# Patient Record
Sex: Female | Born: 1975 | Hispanic: No | Marital: Single | Smoking: Former smoker
Health system: Southern US, Community
[De-identification: ages and names within clinical notes are randomized; demographics above are authoritative.]

## PROBLEM LIST (undated history)

## (undated) DIAGNOSIS — E119 Type 2 diabetes mellitus without complications: Secondary | ICD-10-CM

## (undated) DIAGNOSIS — I10 Essential (primary) hypertension: Secondary | ICD-10-CM

---

## 2017-12-19 ENCOUNTER — Ambulatory Visit (INDEPENDENT_AMBULATORY_CARE_PROVIDER_SITE_OTHER): Payer: Self-pay

## 2017-12-19 ENCOUNTER — Ambulatory Visit (HOSPITAL_COMMUNITY)
Admission: EM | Admit: 2017-12-19 | Discharge: 2017-12-19 | Disposition: A | Discharge status: Home / Self Care | Payer: Self-pay

## 2017-12-19 ENCOUNTER — Encounter (HOSPITAL_COMMUNITY): Payer: Self-pay | Admitting: Emergency Medicine

## 2017-12-19 DIAGNOSIS — M79671 Pain in right foot: Secondary | ICD-10-CM

## 2017-12-19 HISTORY — DX: Type 2 diabetes mellitus without complications: E11.9

## 2017-12-19 MED ORDER — CEPHALEXIN 500 MG PO CAPS: 500.0000 mg | ORAL_CAPSULE | Freq: Four times a day (QID) | ORAL | 0 refills | Status: DC

## 2017-12-19 NOTE — Discharge Instructions (Addendum)
It was nice meeting you!!  No foreign body seen in the foot on xray.  You can do warm soaks to the foot. Keep covered and use bacitracin to prevent infection.  Follow up as needed for continued or worsening symptoms

## 2017-12-19 NOTE — ED Triage Notes (Signed)
Pt here after stepping on piece of wood that went through her right foot

## 2017-12-20 ENCOUNTER — Encounter (HOSPITAL_COMMUNITY): Payer: Self-pay | Admitting: Family Medicine

## 2017-12-20 NOTE — ED Provider Notes (Signed)
[facility]    CSN: [id number] Arrival date & time: 12/19/17  1135     History   Chief Complaint Chief Complaint  Patient presents with  . Foreign Body in Skin    appt 1144    HPI Miranda Barnett is a 42 y.o. female.   Patient is a 42 year old female with past medical history of diabetes.  She presents with sliver in right plantar foot.  This occurred 2 days ago.  She reports that she got a piece of the wood out but she believes there is still a piece in her foot. She has been trying with her boyfriend to get it out. She can feel something when she applies pressure and walks on it. She denies any numbness, tingling or decreased sensation in the foot.  She has been soaking her foot.   ROS per HPI      Past Medical History:  Diagnosis Date  . Diabetes mellitus without complication (HCC)     There are no active problems to display for this patient.   History reviewed. No pertinent surgical history.  OB History   None      Home Medications    Prior to Admission medications   Medication Sig Start Date End Date Taking? Authorizing Provider  insulin NPH-regular Human (NOVOLIN 70/30) (70-30) 100 UNIT/ML injection Inject 40 Units into the skin 2 (two) times daily with a meal.   Yes [provider]  cephALEXin (KEFLEX) 500 MG capsule Take 1 capsule (500 mg total) by mouth 4 (four) times daily. 12/19/17   Janace ArisBast, Krystianna Soth A, NP    Family History History reviewed. No pertinent family history.  Social History Social History   Tobacco Use  . Smoking status: Never Smoker  . Smokeless tobacco: Never Used  Substance Use Topics  . Alcohol use: Never    Frequency: Never  . Drug use: Never     Allergies   Patient has no known allergies.   Review of Systems Review of Systems   Physical Exam Triage Vital Signs ED Triage Vitals [12/19/17 1208]  Enc Vitals Group     BP (!) 135/93     Pulse Rate 80     Resp 16     Temp 98.2 F (36.8 C)     Temp  Source Oral     SpO2 96 %     Weight      Height      Head Circumference      Peak Flow      Pain Score      Pain Loc      Pain Edu?      Excl. in GC?    No data found.  Updated Vital Signs BP (!) 135/93 (BP Location: Right Arm)   Pulse 80   Temp 98.2 F (36.8 C) (Oral)   Resp 16   SpO2 96%   Visual Acuity Right Eye Distance:   Left Eye Distance:   Bilateral Distance:    Right Eye Near:   Left Eye Near:    Bilateral Near:     Physical Exam  Constitutional: She is oriented to person, place, and time. She appears well-developed and well-nourished.  HENT:  Head: Normocephalic and atraumatic.  Eyes: Pupils are equal, round, and reactive to light.  Neck: Normal range of motion.  Pulmonary/Chest: Effort normal.  Musculoskeletal: Normal range of motion.  Tender to plantar foot localized to foreign body entry. No swelling or erythema.   Neurological: She  is oriented to person, place, and time.  Skin: Skin is warm.  Psychiatric: She has a normal mood and affect.  Nursing note and vitals reviewed.    UC Treatments / Results  Labs (all labs ordered are listed, but only abnormal results are displayed) Labs Reviewed - No data to display  EKG None  Radiology Dg Foot Complete Right  Result Date: 12/19/2017 CLINICAL DATA:  Stepped on sharp piece of wood 2 days ago. Heel pain. EXAM: RIGHT FOOT COMPLETE - 3+ VIEW COMPARISON:  None. FINDINGS: No visible radiopaque foreign body. No soft tissue gas. No fracture, subluxation or dislocation. IMPRESSION: No radiopaque foreign body or acute bony abnormality. Electronically Signed   By: Charlett NoseKevin  Dover M.D.   On: 12/19/2017 12:37    Procedures Procedures (including critical care time)  Medications Ordered in UC Medications - No data to display  Initial Impression / Assessment and Plan / UC Course  I have reviewed the triage vital signs and the nursing notes.  Pertinent labs & imaging results that were available during my care  of the patient were reviewed by me and considered in my medical decision making (see chart for details).     Xray revealed no foreign body. Pt very insistent on exploring the foot.  Agreed and attempted to explore the foot. Shaved some of the skin off around the area with 11 blade. Used tweezers.  Thought I visualized foreign body but unable to extract anything. Some serous fluid expressed but no foreign body  Decided to stop due to no obvious foreign body and pt being a diabetic.  Pt agreed.  Will prophylactically with antibiotics. Follow up as needed for continued or worsening symptoms  Final Clinical Impressions(s) / UC Diagnoses   Final diagnoses:  Foot pain, right     Discharge Instructions     It was nice meeting you!!  No foreign body seen in the foot on xray.  You can do warm soaks to the foot. Keep covered and use bacitracin to prevent infection.  Follow up as needed for continued or worsening symptoms     ED Prescriptions    Medication Sig Dispense Auth. Provider   cephALEXin (KEFLEX) 500 MG capsule Take 1 capsule (500 mg total) by mouth 4 (four) times daily. 28 capsule Dahlia ByesBast, Osker Ayoub A, NP     Controlled Substance Prescriptions [place] Controlled Substance Registry consulted? Not Applicable   Janace ArisBast, Clarion Mooneyhan A, NP 12/20/17 1449

## 2018-05-07 DIAGNOSIS — I10 Essential (primary) hypertension: Secondary | ICD-10-CM | POA: Diagnosis not present

## 2018-05-07 DIAGNOSIS — F419 Anxiety disorder, unspecified: Secondary | ICD-10-CM | POA: Diagnosis not present

## 2018-05-07 DIAGNOSIS — E119 Type 2 diabetes mellitus without complications: Secondary | ICD-10-CM | POA: Diagnosis not present

## 2018-05-07 DIAGNOSIS — Z1389 Encounter for screening for other disorder: Secondary | ICD-10-CM | POA: Diagnosis not present

## 2018-10-10 DIAGNOSIS — Z3201 Encounter for pregnancy test, result positive: Secondary | ICD-10-CM | POA: Diagnosis not present

## 2018-11-08 DIAGNOSIS — O0990 Supervision of high risk pregnancy, unspecified, unspecified trimester: Secondary | ICD-10-CM | POA: Insufficient documentation

## 2018-11-09 ENCOUNTER — Ambulatory Visit (INDEPENDENT_AMBULATORY_CARE_PROVIDER_SITE_OTHER): Payer: Medicaid Other | Admitting: Obstetrics & Gynecology

## 2018-11-09 ENCOUNTER — Other Ambulatory Visit (HOSPITAL_COMMUNITY)
Admission: RE | Admit: 2018-11-09 | Discharge: 2018-11-09 | Disposition: A | Discharge status: Home / Self Care | Payer: Medicaid Other | Source: Ambulatory Visit | Attending: Obstetrics & Gynecology | Admitting: Obstetrics & Gynecology

## 2018-11-09 ENCOUNTER — Encounter: Payer: Self-pay | Admitting: Obstetrics & Gynecology

## 2018-11-09 ENCOUNTER — Other Ambulatory Visit: Payer: Self-pay

## 2018-11-09 DIAGNOSIS — Z3A15 15 weeks gestation of pregnancy: Secondary | ICD-10-CM

## 2018-11-09 DIAGNOSIS — O99210 Obesity complicating pregnancy, unspecified trimester: Secondary | ICD-10-CM

## 2018-11-09 DIAGNOSIS — O99212 Obesity complicating pregnancy, second trimester: Secondary | ICD-10-CM

## 2018-11-09 DIAGNOSIS — O09529 Supervision of elderly multigravida, unspecified trimester: Secondary | ICD-10-CM | POA: Insufficient documentation

## 2018-11-09 DIAGNOSIS — Z3490 Encounter for supervision of normal pregnancy, unspecified, unspecified trimester: Secondary | ICD-10-CM

## 2018-11-09 DIAGNOSIS — O09522 Supervision of elderly multigravida, second trimester: Secondary | ICD-10-CM

## 2018-11-09 DIAGNOSIS — O24319 Unspecified pre-existing diabetes mellitus in pregnancy, unspecified trimester: Secondary | ICD-10-CM | POA: Insufficient documentation

## 2018-11-09 DIAGNOSIS — Z3482 Encounter for supervision of other normal pregnancy, second trimester: Secondary | ICD-10-CM | POA: Diagnosis not present

## 2018-11-09 DIAGNOSIS — O24312 Unspecified pre-existing diabetes mellitus in pregnancy, second trimester: Secondary | ICD-10-CM

## 2018-11-09 MED ORDER — BLOOD PRESSURE MONITOR KIT: 1.0000 | PACK | 0 refills | Status: AC

## 2018-11-09 MED ORDER — INSULIN NPH (HUMAN) (ISOPHANE) 100 UNIT/ML ~~LOC~~ SUSP: SUBCUTANEOUS | 3 refills | Status: DC

## 2018-11-09 MED ORDER — INSULIN LISPRO 100 UNIT/ML ~~LOC~~ SOLN: SUBCUTANEOUS | 11 refills | Status: DC

## 2018-11-09 MED ORDER — ASPIRIN EC 81 MG PO TBEC: 81.0000 mg | DELAYED_RELEASE_TABLET | Freq: Every day | ORAL | 5 refills | Status: DC

## 2018-11-09 NOTE — Progress Notes (Signed)
  Subjective:    Miranda Barnett is being seen today for her first obstetrical visit. P2 (52 and 41 yo daughters)   This is not a planned pregnancy. She is at [redacted]w[redacted]d gestation. Her obstetrical history is significant for advanced maternal age, obesity and ama, DM. Relationship with FOB: significant other, not living together. Patient does intend to breast feed. Pregnancy history fully reviewed.  Patient reports no complaints.  Review of Systems:   Review of Systems  She is a homemaker. She has had DM since during her last pregnancy. She has been on insulin since then.   Objective:     BP 130/83   Pulse 93   Temp 98.8 F (37.1 C)   Ht 5\' 8"  (1.727 m)   Wt 239 lb 8 oz (108.6 kg)   LMP 07/26/2018   BMI 36.42 kg/m  Physical Exam  Exam  Breathing, conversing, and ambulating normally Well nourished, well hydrated Black female, no apparent distress Heart- rrr Lungs- CTAB Abd- benign, obese    Assessment:    Pregnancy: [id number] Patient Active Problem List   Diagnosis Date Noted  . AMA (advanced maternal age) multigravida 35+ 11/09/2018  . Obesity in pregnancy 11/09/2018  . Unsp pre-existing diabetes in pregnancy, second trimester 11/09/2018  . Supervision of normal pregnancy, antepartum 11/08/2018       Plan:     Initial labs drawn. Prenatal vitamins. Problem list reviewed and updated. Role of ultrasound in pregnancy discussed; fetal survey: ordered. Amniocentesis discussed: declined. Rec weight gain of 20 pounds or less [employer], Mychart downloaded today rec check sugars 5 x day Check HBA1c, pap smear, labs, cmeta, pr/cr Rec baby asa She reports that her sugars are between 190s- 200s I really stressed the importance of good glycemic control (fastings less than 90, 2 hour PCs less than 120) She is taking 60 units BID of humulin insulin I have told her to stop that dosing and start: 34 units of NPH with breakfast 14 units of Humalog with breakfast 18 units of  Humalog with supper 14 untis of NPH at bedtime Virtual visit in 1 week  Trystin Hargrove C Dareth Andrew 11/09/2018

## 2018-11-09 NOTE — Progress Notes (Signed)
Pt presents for NOB visit.  Appetite is suppressed. Drinks ginger tea to avoid N&V

## 2018-11-11 LAB — PROTEIN / CREATININE RATIO, URINE
Creatinine, Urine: 218.5 mg/dL
Protein, Ur: 19.6 mg/dL
Protein/Creat Ratio: 90 mg/g creat (ref 0–200)

## 2018-11-11 LAB — CULTURE, OB URINE

## 2018-11-11 LAB — URINE CULTURE, OB REFLEX

## 2018-11-12 LAB — COMPREHENSIVE METABOLIC PANEL
ALT: 16 IU/L (ref 0–32)
AST: 11 IU/L (ref 0–40)
Albumin/Globulin Ratio: 1.4 (ref 1.2–2.2)
Albumin: 4.2 g/dL (ref 3.8–4.8)
Alkaline Phosphatase: 50 IU/L (ref 39–117)
BUN/Creatinine Ratio: 13 (ref 9–23)
BUN: 8 mg/dL (ref 6–24)
Bilirubin Total: 0.3 mg/dL (ref 0.0–1.2)
CO2: 19 mmol/L — ABNORMAL LOW (ref 20–29)
Calcium: 9.7 mg/dL (ref 8.7–10.2)
Chloride: 102 mmol/L (ref 96–106)
Creatinine, Ser: 0.62 mg/dL (ref 0.57–1.00)
GFR calc Af Amer: 130 mL/min/{1.73_m2} (ref >59)
GFR calc non Af Amer: 112 mL/min/{1.73_m2} (ref >59)
Globulin, Total: 3 g/dL (ref 1.5–4.5)
Glucose: 87 mg/dL (ref 65–99)
Potassium: 4.4 mmol/L (ref 3.5–5.2)
Sodium: 135 mmol/L (ref 134–144)
Total Protein: 7.2 g/dL (ref 6.0–8.5)

## 2018-11-12 LAB — OBSTETRIC PANEL, INCLUDING HIV
Antibody Screen: NEGATIVE
Basophils Absolute: 0 10*3/uL (ref 0.0–0.2)
Basos: 0 %
EOS (ABSOLUTE): 0.1 10*3/uL (ref 0.0–0.4)
Eos: 1 %
HIV Screen 4th Generation wRfx: NONREACTIVE
Hematocrit: 37.3 % (ref 34.0–46.6)
Hemoglobin: 12.4 g/dL (ref 11.1–15.9)
Hepatitis B Surface Ag: NEGATIVE
Immature Grans (Abs): 0 10*3/uL (ref 0.0–0.1)
Immature Granulocytes: 0 %
Lymphocytes Absolute: 2.1 10*3/uL (ref 0.7–3.1)
Lymphs: 21 %
MCH: 30.8 pg (ref 26.6–33.0)
MCHC: 33.2 g/dL (ref 31.5–35.7)
MCV: 93 fL (ref 79–97)
Monocytes Absolute: 0.6 10*3/uL (ref 0.1–0.9)
Monocytes: 6 %
Neutrophils Absolute: 7.4 10*3/uL — ABNORMAL HIGH (ref 1.4–7.0)
Neutrophils: 72 %
Platelets: 340 10*3/uL (ref 150–450)
RBC: 4.02 x10E6/uL (ref 3.77–5.28)
RDW: 13.1 % (ref 11.7–15.4)
RPR Ser Ql: NONREACTIVE
Rh Factor: POSITIVE
Rubella Antibodies, IGG: 3.36 index (ref >0.99)
WBC: 10.2 10*3/uL (ref 3.4–10.8)

## 2018-11-12 LAB — HEMOGLOBIN A1C
Est. average glucose Bld gHb Est-mCnc: 160 mg/dL
Hgb A1c MFr Bld: 7.2 % — ABNORMAL HIGH (ref 4.8–5.6)

## 2018-11-13 LAB — CYTOLOGY - PAP
Chlamydia: NEGATIVE
Diagnosis: NEGATIVE
HPV: NOT DETECTED
Neisseria Gonorrhea: NEGATIVE

## 2018-11-15 ENCOUNTER — Other Ambulatory Visit: Payer: Self-pay | Admitting: Obstetrics & Gynecology

## 2018-11-16 ENCOUNTER — Ambulatory Visit (INDEPENDENT_AMBULATORY_CARE_PROVIDER_SITE_OTHER): Payer: Medicaid Other | Admitting: Medical

## 2018-11-16 ENCOUNTER — Encounter: Payer: Self-pay | Admitting: Medical

## 2018-11-16 DIAGNOSIS — O24312 Unspecified pre-existing diabetes mellitus in pregnancy, second trimester: Secondary | ICD-10-CM

## 2018-11-16 DIAGNOSIS — Z3A16 16 weeks gestation of pregnancy: Secondary | ICD-10-CM

## 2018-11-16 DIAGNOSIS — O99210 Obesity complicating pregnancy, unspecified trimester: Secondary | ICD-10-CM

## 2018-11-16 DIAGNOSIS — O09522 Supervision of elderly multigravida, second trimester: Secondary | ICD-10-CM

## 2018-11-16 DIAGNOSIS — O99212 Obesity complicating pregnancy, second trimester: Secondary | ICD-10-CM

## 2018-11-16 DIAGNOSIS — Z3490 Encounter for supervision of normal pregnancy, unspecified, unspecified trimester: Secondary | ICD-10-CM

## 2018-11-16 MED ORDER — GLUCOSE BLOOD VI STRP: ORAL_STRIP | 12 refills | Status: DC

## 2018-11-16 NOTE — Patient Instructions (Signed)

## 2018-11-16 NOTE — Progress Notes (Signed)
   Concern about dating  [place] at women's choice   I connected with Miranda Barnett on 08/07/18 at  9:15 AM EDT by: WebEx and verified that I am speaking with the correct person using two identifiers.  Patient is located at home and provider is located at [facility].     The purpose of this virtual visit is to provide medical care while limiting exposure to the novel coronavirus. I discussed the limitations, risks, security and privacy concerns of performing an evaluation and management service by WebEx and the availability of in person appointments. I also discussed with the patient that there may be a patient responsible charge related to this service. By engaging in this virtual visit, you consent to the provision of healthcare.  Additionally, you authorize for your insurance to be billed for the services provided during this visit.  The patient expressed understanding and agreed to proceed.  Ms. Miranda Barnett is a [id number] at [redacted]w[redacted]d who requested a virtual visit today to discuss her insulin plan. At her new OB visit last week her insulin regimen was adjusted from her pre-pregnancy plan by Dr. Hulan Fray. The patient was unclear about the plan and has not yet started. She states this past week her fasting BS have been between 72-96 with most values above 80. She states her PP BS have been between 110-152. She has an appointment with Diabetes education on 11/27/18.   I have discussed the plan with Dr. Elly Modena who agrees with the proposed regimen. I have explained the following to the patient:  34 units of NPH with breakfast 14 units of Humalog with breakfast  18 units of Humalog with supper  14 untis of NPH at bedtime   I have explained the difference between her previously prescribed Novolin R and the currently prescribed Novolin N. I have explained the importance of eating with Humalog doses. The patient plans to start this regiment ASAP and will continue to check CBGs 4 times per day.   The patient also  has concerns about the accuracy of her due date. She states that she had an US done at a clinic at [redacted]w[redacted]d in May. By this dating her due date would be ~ 2 weeks earlier than we have documented based on LMP. I have asked the patient to get a copy of the [place] report from that clinic for review. I have assured her that now that she is in the second trimester there would be no benefit to another [place] for dating prior to her anatomy US since the criteria for changes in [facility] are the same at 16 weeks as at 19 weeks. I have also assured her that we will have her [place] recommended dating prior to the point in pregnancy when it is needed to determine when to start antenatal testing or schedule IOL.   Patient voiced understanding of all of the above. She will follow-up with diabetes education on 11/27/18. She has anatomy US scheduled for 12/06/18. She already has a follow-up for her next OB appointment on 12/16/18 with Dr. Elly Modena.   Luvenia Redden, PA-C 11/16/2018 12:13 PM

## 2018-11-16 NOTE — Progress Notes (Signed)
Pt presents for Webex visit. Pt identified with 2 pt identifiers. Pt is still waiting on on bp kit. Pt would like to discuss her dates. She would also like to discuss her insulin.

## 2018-11-19 ENCOUNTER — Encounter: Payer: Self-pay | Admitting: Obstetrics & Gynecology

## 2018-11-20 ENCOUNTER — Encounter: Payer: Self-pay | Admitting: Obstetrics & Gynecology

## 2018-11-21 ENCOUNTER — Encounter: Payer: Self-pay | Admitting: Obstetrics and Gynecology

## 2018-11-21 DIAGNOSIS — Z98891 History of uterine scar from previous surgery: Secondary | ICD-10-CM | POA: Insufficient documentation

## 2018-11-21 DIAGNOSIS — Z3481 Encounter for supervision of other normal pregnancy, first trimester: Secondary | ICD-10-CM | POA: Diagnosis not present

## 2018-11-27 ENCOUNTER — Other Ambulatory Visit: Payer: Self-pay

## 2018-11-27 ENCOUNTER — Ambulatory Visit: Payer: Medicaid Other | Admitting: *Deleted

## 2018-11-27 ENCOUNTER — Encounter: Payer: Medicaid Other | Attending: Obstetrics & Gynecology | Admitting: *Deleted

## 2018-11-27 DIAGNOSIS — O24312 Unspecified pre-existing diabetes mellitus in pregnancy, second trimester: Secondary | ICD-10-CM | POA: Diagnosis not present

## 2018-11-27 DIAGNOSIS — O09522 Supervision of elderly multigravida, second trimester: Secondary | ICD-10-CM | POA: Insufficient documentation

## 2018-11-27 DIAGNOSIS — Z3A00 Weeks of gestation of pregnancy not specified: Secondary | ICD-10-CM | POA: Insufficient documentation

## 2018-11-27 DIAGNOSIS — Z713 Dietary counseling and surveillance: Secondary | ICD-10-CM | POA: Diagnosis not present

## 2018-11-28 NOTE — Progress Notes (Signed)
Patient was seen on 11/27/2018 for type 2 Diabetes and pregnancy self-management. EDD 05/02/2019. Patient states history of this diabetes for a couple of years.  She states she had GDM with her 2nd daughter in 2018 and the diabetes came back after she was born. She has been on Novolin 70/30 insulin since then. Her insulin doses have just been changed, see below. Diet history obtained. Patient eats good variety of all food groups and beverages include water along with sweet tea, lemonade and regular soda.  She states some training on Carbohydrate Counting but has not been using it lately, she would like a review. Patient is currently on Metformin diabetes medication in addition to insulin. She states she is active with her children at home and does stretches and yoga regularly   Current insulin dose: NPH @ 34 units before breakfast and 14 units at bedtime Humalog @ 18 units before breakfast and 14 units before supper  She has 100 unit syringes so I taught her how to mix the NPH and Novolog before breakfast and take 1 injection instead of 2. This is a total of 52 units in the AM. She expressed understanding with return demonstration.   I also mentioned Omnipod as an alternative insulin delivery option. She will consider and discuss with her fiancee and let me know if she wants to pursue.   The following learning objectives were also met by the patient :   States the definition of type 2 Diabetes and pregnancy   States why dietary management is important in controlling blood glucose  Describes the effects of carbohydrates on blood glucose levels  Demonstrates ability to create a balanced meal plan  Demonstrates carbohydrate counting   States when to check blood glucose levels  Demonstrates proper blood glucose monitoring techniques  States the effect of stress and exercise on blood glucose levels  States the importance of limiting caffeine and abstaining from alcohol and smoking  Plan:    Aim for 3 Carb Choices per meal (45 grams) +/- 1 either way   Aim for 1-2 Carbs per snack  Begin reading food labels for Total Carbohydrate of foods  Consider  increasing your activity level by walking or other activity daily as tolerated  Begin checking BG before breakfast and 2 hours after first bite of breakfast, lunch and dinner as directed by MD   Bring Log Book/Sheet and meter to every medical appointment OR use Baby Scripts (see below)  Patient was introduced to [employer], she plans to use as record of BG electronically (She had fallen behind in posting her BG to [employer]. She agrees to add in the missing data so I can see her most recent BG. I will then assess to see if insulin doses need to be adjusted.  Take medication as directed by MD  Patient already has a meter:  And is testing pre breakfast and 2 hours each meal as directed by MD Plan to review Baby Scripts tomorrow.   Patient instructed to monitor glucose levels: FBS: 60 - 95 mg/dl 2 hour: <120 mg/dl  Patient received the following handouts:  Nutrition Diabetes and Pregnancy  Carbohydrate Counting List  Patient will be seen for follow-up in 1 month and as needed.

## 2018-12-04 ENCOUNTER — Other Ambulatory Visit: Payer: Self-pay | Admitting: Obstetrics & Gynecology

## 2018-12-04 ENCOUNTER — Telehealth: Payer: Self-pay | Admitting: *Deleted

## 2018-12-04 DIAGNOSIS — O24312 Unspecified pre-existing diabetes mellitus in pregnancy, second trimester: Secondary | ICD-10-CM

## 2018-12-04 MED ORDER — INSULIN NPH (HUMAN) (ISOPHANE) 100 UNIT/ML ~~LOC~~ SUSP: SUBCUTANEOUS | 3 refills | Status: DC

## 2018-12-04 MED ORDER — INSULIN LISPRO 100 UNIT/ML ~~LOC~~ SOLN: SUBCUTANEOUS | 11 refills | Status: DC

## 2018-12-04 NOTE — Progress Notes (Signed)
As per the Diabetic Specialist, review of patient's blood sugars showed elevated values. Recommendations for increased insulin ordered as below.   FBG for past several days are 86-132 with only 3 in target range  Post meal BG have been 115-143 with only 3 out of 17 in target range, most in the 130's   Recommendation:  Current dose:     Increase to:  AM NPH @ 34       38     Reg @ 18       22  Supper    Reg 14          20  Bedtime     NPH 14         20   Orders: - insulin NPH Human (NOVOLIN N) 100 UNIT/ML injection; Inject 38 units in the morning and 20 units at bedtime  Dispense: 10 mL; Refill: 3 - insulin lispro (HUMALOG) 100 UNIT/ML injection; Inject 22 units with breakfast and 20 units with supper/evening meal  Dispense: 10 mL; Refill: 11  Patient was informed to change dosages accordingly. Will monitor effect and continue to adjust regimen as needed.    Verita Schneiders, MD, [place] for [employer], [employer]

## 2018-12-04 NOTE — Telephone Encounter (Signed)
I called patient to let her know that her insulin doses were being increased per Dr. Jeani Sow. Anyanwu. I offered to text the changes to her so she would have them in writing and she agreed.       Pre breakfast NPH:   34 up to 38 Reg:    18 up to 22  Supper Reg:     14 up to 20  Bedtime NPH>     14 up to 20  She texted me back her understanding.   I will follow up within 2 days on Baby Scripts to check readings.

## 2018-12-05 NOTE — Progress Notes (Signed)
Pt made aware of changes to insulin, verbalizes understanding.

## 2018-12-05 NOTE — Telephone Encounter (Signed)
Thank you :)

## 2018-12-06 ENCOUNTER — Other Ambulatory Visit (HOSPITAL_COMMUNITY): Payer: Self-pay | Admitting: *Deleted

## 2018-12-06 ENCOUNTER — Ambulatory Visit (HOSPITAL_COMMUNITY)
Admission: RE | Admit: 2018-12-06 | Discharge: 2018-12-06 | Disposition: A | Discharge status: Home / Self Care | Payer: Medicaid Other | Source: Ambulatory Visit | Attending: Obstetrics & Gynecology | Admitting: Obstetrics & Gynecology

## 2018-12-06 ENCOUNTER — Encounter (HOSPITAL_COMMUNITY): Payer: Self-pay | Admitting: *Deleted

## 2018-12-06 ENCOUNTER — Other Ambulatory Visit: Payer: Self-pay

## 2018-12-06 ENCOUNTER — Ambulatory Visit (HOSPITAL_COMMUNITY): Payer: Medicaid Other | Admitting: *Deleted

## 2018-12-06 DIAGNOSIS — O09522 Supervision of elderly multigravida, second trimester: Secondary | ICD-10-CM

## 2018-12-06 DIAGNOSIS — Z3A21 21 weeks gestation of pregnancy: Secondary | ICD-10-CM

## 2018-12-06 DIAGNOSIS — Z3490 Encounter for supervision of normal pregnancy, unspecified, unspecified trimester: Secondary | ICD-10-CM | POA: Diagnosis not present

## 2018-12-06 DIAGNOSIS — O24112 Pre-existing diabetes mellitus, type 2, in pregnancy, second trimester: Secondary | ICD-10-CM | POA: Diagnosis not present

## 2018-12-06 DIAGNOSIS — O99210 Obesity complicating pregnancy, unspecified trimester: Secondary | ICD-10-CM

## 2018-12-06 DIAGNOSIS — O99212 Obesity complicating pregnancy, second trimester: Secondary | ICD-10-CM

## 2018-12-06 DIAGNOSIS — O24312 Unspecified pre-existing diabetes mellitus in pregnancy, second trimester: Secondary | ICD-10-CM

## 2018-12-06 DIAGNOSIS — Z362 Encounter for other antenatal screening follow-up: Secondary | ICD-10-CM

## 2018-12-11 ENCOUNTER — Encounter (HOSPITAL_COMMUNITY): Payer: Self-pay | Admitting: Obstetrics & Gynecology

## 2018-12-11 DIAGNOSIS — Z3A19 19 weeks gestation of pregnancy: Secondary | ICD-10-CM | POA: Diagnosis not present

## 2018-12-11 DIAGNOSIS — E119 Type 2 diabetes mellitus without complications: Secondary | ICD-10-CM | POA: Diagnosis not present

## 2018-12-11 DIAGNOSIS — O24112 Pre-existing diabetes mellitus, type 2, in pregnancy, second trimester: Secondary | ICD-10-CM | POA: Diagnosis not present

## 2018-12-14 ENCOUNTER — Other Ambulatory Visit: Payer: Self-pay

## 2018-12-14 ENCOUNTER — Telehealth (INDEPENDENT_AMBULATORY_CARE_PROVIDER_SITE_OTHER): Payer: Medicaid Other | Admitting: Obstetrics and Gynecology

## 2018-12-14 ENCOUNTER — Encounter: Payer: Self-pay | Admitting: Obstetrics and Gynecology

## 2018-12-14 DIAGNOSIS — Z98891 History of uterine scar from previous surgery: Secondary | ICD-10-CM

## 2018-12-14 DIAGNOSIS — O99212 Obesity complicating pregnancy, second trimester: Secondary | ICD-10-CM

## 2018-12-14 DIAGNOSIS — O09522 Supervision of elderly multigravida, second trimester: Secondary | ICD-10-CM

## 2018-12-14 DIAGNOSIS — Z3A20 20 weeks gestation of pregnancy: Secondary | ICD-10-CM

## 2018-12-14 DIAGNOSIS — O99210 Obesity complicating pregnancy, unspecified trimester: Secondary | ICD-10-CM

## 2018-12-14 DIAGNOSIS — O339 Maternal care for disproportion, unspecified: Secondary | ICD-10-CM | POA: Diagnosis not present

## 2018-12-14 DIAGNOSIS — Z3490 Encounter for supervision of normal pregnancy, unspecified, unspecified trimester: Secondary | ICD-10-CM

## 2018-12-14 DIAGNOSIS — O24312 Unspecified pre-existing diabetes mellitus in pregnancy, second trimester: Secondary | ICD-10-CM

## 2018-12-14 MED ORDER — COMFORT FIT MATERNITY SUPP LG MISC: 0 refills | Status: DC

## 2018-12-14 NOTE — Progress Notes (Signed)
TELEHEALTH OBSTETRICS PRENATAL VIRTUAL VIDEO VISIT ENCOUNTER NOTE  Provider [facility]: [facility]   I connected with Miranda Barnett on 12/14/18 at 10:00 AM EDT by MyChart Video Encounter at home and verified that I am speaking with the correct person using two identifiers.   I discussed the limitations, risks, security and privacy concerns of performing an evaluation and management service virtually and the availability of in person appointments. I also discussed with the patient that there may be a patient responsible charge related to this service. The patient expressed understanding and agreed to proceed. Subjective:  Miranda Barnett is a 43 y.o. G3P2002 at 3379w1d being seen today for ongoing prenatal care.  She is currently monitored for the following issues for this high-risk pregnancy and has Supervision of normal pregnancy, antepartum; AMA (advanced maternal age) multigravida 35+; Obesity in pregnancy; Unsp pre-existing diabetes in pregnancy, second trimester; and History of 2 cesarean sections on their problem list.  Patient reports sciatic nerve pain on left side.  Contractions: Not present. Vag. Bleeding: None.  Movement: Present. Denies any leaking of fluid.   The following portions of the patient's history were reviewed and updated as appropriate: allergies, current medications, past family history, past medical history, past social history, past surgical history and problem list.   Objective:  There were no vitals filed for this visit.  Fetal Status:     Movement: Present     General:  Alert, oriented and cooperative. Patient is in no acute distress.  Respiratory: Normal respiratory effort, no problems with respiration noted  Mental Status: Normal mood and affect. Normal behavior. Normal judgment and thought content.  Rest of physical exam deferred due to type of encounter  Imaging: Koreas Mfm Ob Detail +14 Wk  Result Date: 12/06/2018  ----------------------------------------------------------------------  OBSTETRICS REPORT                       (Signed Final 12/06/2018 11:33 am) ---------------------------------------------------------------------- Patient Info  ID #:       [id number]                          D.O.B.:  08/09/1975 (43 yrs)  Name:       Miranda Barnett                   Visit Date: 12/06/2018 09:18 am ---------------------------------------------------------------------- Performed By  Performed By:     Sandi MealyJovancia Adrien        Ref. Address:     [address]                    [address]                                                             [address]                                                             [place], [place]  [place]  Attending:        Tama High MD        [facility]  Referred By:      Emily Filbert MD ---------------------------------------------------------------------- Orders   #  Description                          Code         Ordered By   1  [place] MFM OB DETAIL +14 WK              76811.01     MYRA DOVE  ----------------------------------------------------------------------   #  Order #                    Accession #                 Episode #   1  [id number]                  [id number]                  [id number]  ---------------------------------------------------------------------- Indications   Encounter for antenatal screening for          Z36.3   malformations (Low Risk NIPS)   Pre-existing diabetes, type 2, in pregnancy,   O24.112   second trimester   Advanced maternal age multigravida 18+,        O65.522   second trimester   Obesity complicating pregnancy, second         O99.212   trimester   [redacted] weeks gestation of pregnancy                Z3A.21  ---------------------------------------------------------------------- Vital Signs  Weight (lb): 239                                Height:        5'8"  BMI:         36.34 ---------------------------------------------------------------------- Fetal Evaluation  Num Of Fetuses:         1  Fetal Heart Rate(bpm):  145  Cardiac Activity:       Observed  Presentation:           Cephalic  Placenta:               Anterior  P. Cord Insertion:      Visualized  Amniotic Fluid  AFI FV:      Within normal limits                              Largest Pocket(cm)                              5.53 ---------------------------------------------------------------------- Biometry  BPD:      48.6  mm     G. Age:  20w 5d  36  %    CI:        74.37   %    70 - 86                                                          FL/HC:      18.9   %    15.9 - 20.3  HC:      178.9  mm     G. Age:  20w 2d         15  %    HC/AC:      1.14        1.06 - 1.25  AC:      156.8  mm     G. Age:  20w 6d         37  %    FL/BPD:     69.8   %  FL:       33.9  mm     G. Age:  20w 5d         29  %    FL/AC:      21.6   %    20 - 24  CER:      20.7  mm     G. Age:  19w 5d         21  %  CM:        4.3  mm  Est. FW:     372  gm    0 lb 13 oz      30  % ---------------------------------------------------------------------- Gestational Age  U/S Today:     20w 5d                                        EDD:   04/20/19  Best:          [place]21w 0d     Det. By:  Previous Ultrasound      EDD:   04/18/19                                      (09/25/18) ---------------------------------------------------------------------- Anatomy  Cranium:               Appears normal         Aortic Arch:            Not well visualized  Cavum:                 Appears normal         Ductal Arch:            Appears normal  Ventricles:            Appears normal         Diaphragm:              Appears normal  Choroid Plexus:        Appears normal         Stomach:                Appears normal, left  sided  Cerebellum:            Appears normal          Abdomen:                Appears normal  Posterior Fossa:       Appears normal         Abdominal Wall:         Appears nml (cord                                                                        insert, abd wall)  Nuchal Fold:           Not applicable (>20    Cord Vessels:           Appears normal ([redacted]                         wks GA)                                        vessel cord)  Face:                  Appears normal         Kidneys:                Not well visualized                         (orbits and profile)  Lips:                  Not well visualized    Bladder:                Appears normal  Thoracic:              Appears normal         Spine:                  Ltd views no                                                                        intracranial signs of                                                                        NTD  Heart:                 Not well visualized    Upper Extremities:      Appears normal  RVOT:  Appears normal         Lower Extremities:      Appears normal  LVOT:                  Appears normal  Other:  Nasal bone visualized. Female gender Technically difficult due to fetal          position. ---------------------------------------------------------------------- Impression  G3 P2. Patient is her for fetal anatomy scan. On cell-free fetal  DNA screening, the risks of aneuploidies were not increased.  She has pregestational diabetes and her most-recent  hemoglobin A1C was 7.2%. Patient takes Novolin 32 units in  the morning and 20 units at night, and Humalog 20/0/20 units  at night. She reports that diabetes is well controlled. She also  has chronic hypertension and HCTZ for control.  Ob history is significant for 2 previous term cesarean  deliveries.  We performed a fetal anatomy scan. No markers of  aneuploidies or fetal structural defects are seen. Fetal  biometry is consistent with her previously-established dates.  Amniotic fluid is normal  and good fetal activity is seen.  Placenta is anterior and there is no evidence of previa or  accreta.  Patient understands the limitations of ultrasound in detecting  fetal anomalies.  I briefly discussed the importance of good blood glucose  control to prevent adverse fetal and neonatal outcomes. I  discussed and recommended fetal echocardiography. ---------------------------------------------------------------------- Recommendations  -An appointment was made for her to return in 4 weeks for  completion of fetal anatomy.  -We will set up an appointment for fetal echocardiography. ----------------------------------------------------------------------                  Noralee Space, MD Electronically Signed Final Report   12/06/2018 11:33 am ----------------------------------------------------------------------   Assessment and Plan:  Pregnancy: Z6X0960 at [redacted]w[redacted]d 1. Encounter for supervision of normal pregnancy, antepartum, unspecified gravidity Patient reports feeling well Rx for maternity belt provided and patient is encouraged to stay active   2. Unsp pre-existing diabetes in pregnancy, second trimester Normal fetal echo on 8/11 CBGs reviewed and majority within range. Continue insulin at current dosage and patient encourage to consume a protein rich snack Follow up anatomy and growth ultrasound in september  3. History of 2 cesarean sections Will be scheduled for repeat at a later date  4. Multigravida of advanced maternal age in second trimester Low risk NIPS  5. Obesity in pregnancy   Preterm labor symptoms and general obstetric precautions including but not limited to vaginal bleeding, contractions, leaking of fluid and fetal movement were reviewed in detail with the patient. I discussed the assessment and treatment plan with the patient. The patient was provided an opportunity to ask questions and all were answered. The patient agreed with the plan and demonstrated an understanding of  the instructions. The patient was advised to call back or seek an in-person office evaluation/go to MAU at [facility] for any urgent or concerning symptoms. Please refer to After Visit Summary for other counseling recommendations.   I provided 15 minutes of face-to-face time during this encounter.  Return in about 2 weeks (around 12/28/2018) for [facility], ROB, High risk.  Future Appointments  Date Time Provider Department Center  01/03/2019  7:45 AM WH-MFC NURSE [facility]-MFC MFC-US  01/03/2019  7:45 AM WH-MFC [place] 2 WH-MFCUS MFC-US    Catalina Antigua, MD Center for [employer], [facility]

## 2018-12-14 NOTE — Progress Notes (Signed)
Pt states glucose has been doing well but readings have been increased after lunch.  Pt states she usually feels a little more sluggish around this time as well.  Pt states she has been having increase in sinus congestion in the evening.  Advised pt to try allergy medication daily.

## 2019-01-03 ENCOUNTER — Other Ambulatory Visit (HOSPITAL_COMMUNITY): Payer: Self-pay | Admitting: *Deleted

## 2019-01-03 ENCOUNTER — Encounter (HOSPITAL_COMMUNITY): Payer: Self-pay

## 2019-01-03 ENCOUNTER — Ambulatory Visit (HOSPITAL_COMMUNITY): Payer: Medicaid Other | Admitting: *Deleted

## 2019-01-03 ENCOUNTER — Ambulatory Visit (HOSPITAL_COMMUNITY)
Admission: RE | Admit: 2019-01-03 | Discharge: 2019-01-03 | Disposition: A | Discharge status: Home / Self Care | Payer: Medicaid Other | Source: Ambulatory Visit | Attending: Obstetrics and Gynecology | Admitting: Obstetrics and Gynecology

## 2019-01-03 ENCOUNTER — Other Ambulatory Visit: Payer: Self-pay

## 2019-01-03 DIAGNOSIS — O99210 Obesity complicating pregnancy, unspecified trimester: Secondary | ICD-10-CM | POA: Insufficient documentation

## 2019-01-03 DIAGNOSIS — O10012 Pre-existing essential hypertension complicating pregnancy, second trimester: Secondary | ICD-10-CM

## 2019-01-03 DIAGNOSIS — O34219 Maternal care for unspecified type scar from previous cesarean delivery: Secondary | ICD-10-CM

## 2019-01-03 DIAGNOSIS — O99212 Obesity complicating pregnancy, second trimester: Secondary | ICD-10-CM

## 2019-01-03 DIAGNOSIS — Z362 Encounter for other antenatal screening follow-up: Secondary | ICD-10-CM | POA: Diagnosis not present

## 2019-01-03 DIAGNOSIS — O09522 Supervision of elderly multigravida, second trimester: Secondary | ICD-10-CM | POA: Diagnosis not present

## 2019-01-03 DIAGNOSIS — O24112 Pre-existing diabetes mellitus, type 2, in pregnancy, second trimester: Secondary | ICD-10-CM

## 2019-01-03 DIAGNOSIS — O24312 Unspecified pre-existing diabetes mellitus in pregnancy, second trimester: Secondary | ICD-10-CM | POA: Diagnosis present

## 2019-01-03 DIAGNOSIS — Z3A25 25 weeks gestation of pregnancy: Secondary | ICD-10-CM

## 2019-01-03 DIAGNOSIS — IMO0001 Reserved for inherently not codable concepts without codable children: Secondary | ICD-10-CM

## 2019-01-07 ENCOUNTER — Encounter: Payer: Self-pay | Admitting: Family Medicine

## 2019-01-10 ENCOUNTER — Telehealth (INDEPENDENT_AMBULATORY_CARE_PROVIDER_SITE_OTHER): Payer: Medicaid Other | Admitting: Family Medicine

## 2019-01-10 ENCOUNTER — Encounter: Payer: Self-pay | Admitting: Family Medicine

## 2019-01-10 DIAGNOSIS — O24312 Unspecified pre-existing diabetes mellitus in pregnancy, second trimester: Secondary | ICD-10-CM

## 2019-01-10 DIAGNOSIS — Z3A26 26 weeks gestation of pregnancy: Secondary | ICD-10-CM

## 2019-01-10 DIAGNOSIS — Z98891 History of uterine scar from previous surgery: Secondary | ICD-10-CM

## 2019-01-10 DIAGNOSIS — O09522 Supervision of elderly multigravida, second trimester: Secondary | ICD-10-CM

## 2019-01-10 DIAGNOSIS — Z3490 Encounter for supervision of normal pregnancy, unspecified, unspecified trimester: Secondary | ICD-10-CM

## 2019-01-10 DIAGNOSIS — O99212 Obesity complicating pregnancy, second trimester: Secondary | ICD-10-CM | POA: Diagnosis not present

## 2019-01-10 DIAGNOSIS — O34219 Maternal care for unspecified type scar from previous cesarean delivery: Secondary | ICD-10-CM | POA: Diagnosis not present

## 2019-01-10 DIAGNOSIS — O99210 Obesity complicating pregnancy, unspecified trimester: Secondary | ICD-10-CM

## 2019-01-10 MED ORDER — INSULIN NPH (HUMAN) (ISOPHANE) 100 UNIT/ML ~~LOC~~ SUSP: SUBCUTANEOUS | 3 refills | Status: DC

## 2019-01-10 MED ORDER — WRIST BRACE DELUXE/LEFT S/M MISC: 1.0000 [IU] | Freq: Every day | 0 refills | Status: DC

## 2019-01-10 MED ORDER — WRIST BRACE DELUXE/RIGHT S/M MISC: 1.0000 [IU] | Freq: Every day | 0 refills | Status: DC

## 2019-01-10 NOTE — Progress Notes (Signed)
TELEHEALTH OBSTETRICS PRENATAL VIRTUAL VIDEO VISIT ENCOUNTER NOTE  Provider [facility]: [facility]   I connected with Miranda Barnett on 01/10/19 at 10:45 AM EDT by MyChart Video Encounter at home and verified that I am speaking with the correct person using two identifiers.   I discussed the limitations, risks, security and privacy concerns of performing an evaluation and management service virtually and the availability of in person appointments. I also discussed with the patient that there may be a patient responsible charge related to this service. The patient expressed understanding and agreed to proceed. Subjective:  Miranda Barnett is a 43 y.o. G3P2002 at 918w0d being seen today for ongoing prenatal care.  She is currently monitored for the following issues for this high-risk pregnancy and has Supervision of normal pregnancy, antepartum; AMA (advanced maternal age) multigravida 35+; Obesity in pregnancy; Unsp pre-existing diabetes in pregnancy, second trimester; and History of 2 cesarean sections on their problem list.  Patient reports carpal tunnel symptoms.  Contractions: Not present. Vag. Bleeding: None.  Movement: Present. Denies any leaking of fluid.   The following portions of the patient's history were reviewed and updated as appropriate: allergies, current medications, past family history, past medical history, past social history, past surgical history and problem list.   Objective:  There were no vitals filed for this visit.  Fetal Status:     Movement: Present     General:  Alert, oriented and cooperative. Patient is in no acute distress.  Respiratory: Normal respiratory effort, no problems with respiration noted  Mental Status: Normal mood and affect. Normal behavior. Normal judgment and thought content.  Rest of physical exam deferred due to type of encounter  Imaging: [place]as Mfm Ob Follow Up  Result Date: 01/03/2019  ----------------------------------------------------------------------  OBSTETRICS REPORT                       (Signed Final 01/03/2019 10:13 am) ---------------------------------------------------------------------- Patient Info  ID #:       [id number]                          D.O.B.:  12-01-1975 (43 yrs)  [facility]                   Visit Date: 01/03/2019 08:57 am ---------------------------------------------------------------------- Performed By  Performed By:     Eden Lathearrie Stalter BS      Ref. Address:     [address]                    [address]                                                             [address]                                                             [place], [place]  [address]  Attending:        Noralee Space MD        [facility]  Referred By:      Allie Bossier MD ---------------------------------------------------------------------- Orders   #  Description                          Code         Ordered By   1  [place] MFM OB FOLLOW UP                  40347.42     Noralee Space  ----------------------------------------------------------------------   #  Order #                    Accession #                 Episode #   1  [id number]                  [id number]                  [id number]  ---------------------------------------------------------------------- Indications   Antenatal follow-up for nonvisualized fetal    Z36.2   anatomy   Pre-existing diabetes, type 2, in pregnancy,   O24.112   second trimester (insulin a1c 7.2)   Hypertension - Chronic/Pre-existing            O10.019   Advanced maternal age multigravida 36+,        O34.522   second trimester   Obesity complicating pregnancy, second         O99.212   trimester   [redacted] weeks gestation of pregnancy                Z3A.25   Previous cesarean delivery, antepartum x 2     O34.219   ---------------------------------------------------------------------- Vital Signs                                                 Height:        5'8" ---------------------------------------------------------------------- Fetal Evaluation  Num Of Fetuses:         1  Fetal Heart Rate(bpm):  148  Cardiac Activity:       Observed  Presentation:           Breech  Placenta:               Anterior  P. Cord Insertion:      Visualized, central  Amniotic Fluid  AFI FV:      Within normal limits                              Largest Pocket(cm)  6 ---------------------------------------------------------------------- Biometry  BPD:      59.7  mm     G. Age:  24w 3d         21  %    CI:        68.38   %    70 - 86                                                          FL/HC:      19.5   %    18.7 - 20.3  HC:      230.8  mm     G. Age:  25w 1d         32  %    HC/AC:      1.19        1.04 - 1.22  AC:       194   mm     G. Age:  24w 1d         16  %    FL/BPD:     75.2   %    71 - 87  FL:       44.9  mm     G. Age:  24w 6d         31  %    FL/AC:      23.1   %    20 - 24  HUM:      41.4  mm     G. Age:  25w 0d         43  %  CER:      26.6  mm     G. Age:  24w 2d         29  %  CM:        4.3  mm  Est. FW:     701  gm      1 lb 9 oz     20  % ---------------------------------------------------------------------- Gestational Age  U/S Today:     24w 5d                                        EDD:   04/20/19  Best:          Alcus Dad 0d     Det. By:  Previous Ultrasound      EDD:   04/18/19                                      (09/25/18) ---------------------------------------------------------------------- Anatomy  Cranium:               Appears normal         Aortic Arch:            Appears normal  Cavum:                 Appears normal         Ductal Arch:            Appears normal  Ventricles:            Appears normal  Diaphragm:              Appears normal  Choroid Plexus:        Appears normal          Stomach:                Appears normal, left                                                                        sided  Cerebellum:            Appears normal         Abdomen:                Appears normal  Posterior Fossa:       Appears normal         Abdominal Wall:         Previously seen  Nuchal Fold:           Not applicable (>20    Cord Vessels:           Previously seen                         wks GA)  Face:                  Appears normal         Kidneys:                Appear normal                         (orbits and profile)  Lips:                  Appears normal         Bladder:                Appears normal  Thoracic:              Appears normal         Spine:                  Appears normal  Heart:                 Appears normal         Upper Extremities:      Appears normal                         (4CH, axis, and                         situs)  RVOT:                  Appears normal         Lower Extremities:      Appears normal  LVOT:                  Appears normal  Other:  Heels/feet and hands visualized. Nasal bone visualized. Technically          difficult due to maternal habitus and fetal  position. ---------------------------------------------------------------------- Cervix Uterus Adnexa  Uterus  No abnormality visualized.  Left Ovary  Within normal limits.  Right Ovary  Within normal limits.  Cul De Sac  No free fluid seen.  Adnexa  No abnormality visualized. ---------------------------------------------------------------------- Impression  Patient returned for completion of fetal anatomy. Fetal  echocardiography was reported as normal.  Amniotic fluid is normal and good fetal activity is seen. Fetal  growth is appropriate for gestational age. ---------------------------------------------------------------------- Recommendations  -An appointment was made for her to return in 4 weeks for  fetal growth assessment. ----------------------------------------------------------------------                   Tama High, MD Electronically Signed Final Report   01/03/2019 10:13 am ----------------------------------------------------------------------   Assessment and Plan:  Pregnancy: I6O0321 at [redacted]w[redacted]d 1. Encounter for supervision of normal pregnancy, antepartum, unspecified gravidity Good fetal movement. Needs CBC, HIV, RPR next visit  2. Obesity in pregnancy  3. Unsp pre-existing diabetes in pregnancy, second trimester CBGs noted to be elevated fasting: 110, PP 130. Will increase bedtime NPH - decreasing fasting CBGs may improve overall daytime CBGs. - insulin NPH Human (NOVOLIN N) 100 UNIT/ML injection; Inject 38 units in the morning and 25 units at bedtime  Dispense: 10 mL; Refill: 3  4. Multigravida of advanced maternal age in second trimester  5. History of 2 cesarean sections   Preterm labor symptoms and general obstetric precautions including but not limited to vaginal bleeding, contractions, leaking of fluid and fetal movement were reviewed in detail with the patient. I discussed the assessment and treatment plan with the patient. The patient was provided an opportunity to ask questions and all were answered. The patient agreed with the plan and demonstrated an understanding of the instructions. The patient was advised to call back or seek an in-person office evaluation/go to [facility]U at [facility] for any urgent or concerning symptoms. Please refer to After Visit Summary for other counseling recommendations.   I provided 11 minutes of face-to-face time during this encounter.  No follow-ups on file.  Future Appointments  Date Time Provider [place]  01/31/2019 10:10 AM [place] MFC-US  01/31/2019 10:15 AM [place] for [employer], [place]

## 2019-01-10 NOTE — Progress Notes (Signed)
I connected with Miranda Barnett on 01/10/19 at 10:45 AM EDT by telephone and verified that I am speaking with the correct person using two identifiers.  GDM: Fasting 115, p.c. 130's  Pt c/o numbness in fingers, feet, and legs while sleeping x 2 weeks.  Unable to check BP at this time. Pt informed to enter reading into [employer] when she gets a chance.

## 2019-01-11 ENCOUNTER — Other Ambulatory Visit: Payer: Self-pay

## 2019-01-11 MED ORDER — ACCU-CHEK GUIDE W/DEVICE KIT: 1.0000 | PACK | Freq: Four times a day (QID) | 0 refills | Status: AC

## 2019-01-11 MED ORDER — ACCU-CHEK FASTCLIX LANCETS MISC: 1.0000 [IU] | Freq: Four times a day (QID) | 12 refills | Status: DC

## 2019-01-11 MED ORDER — ACCU-CHEK GUIDE VI STRP: ORAL_STRIP | 12 refills | Status: DC

## 2019-01-11 NOTE — Progress Notes (Signed)
Diabetes supplies covered by Medicaid sent.

## 2019-01-24 ENCOUNTER — Ambulatory Visit (INDEPENDENT_AMBULATORY_CARE_PROVIDER_SITE_OTHER): Payer: Medicaid Other | Admitting: Internal Medicine

## 2019-01-24 ENCOUNTER — Other Ambulatory Visit: Payer: Self-pay

## 2019-01-24 VITALS — BP 135/84 | HR 101 | Wt 257.3 lb

## 2019-01-24 DIAGNOSIS — O10913 Unspecified pre-existing hypertension complicating pregnancy, third trimester: Secondary | ICD-10-CM

## 2019-01-24 DIAGNOSIS — Z98891 History of uterine scar from previous surgery: Secondary | ICD-10-CM

## 2019-01-24 DIAGNOSIS — Z3A28 28 weeks gestation of pregnancy: Secondary | ICD-10-CM

## 2019-01-24 DIAGNOSIS — Z23 Encounter for immunization: Secondary | ICD-10-CM | POA: Diagnosis not present

## 2019-01-24 DIAGNOSIS — O99213 Obesity complicating pregnancy, third trimester: Secondary | ICD-10-CM

## 2019-01-24 DIAGNOSIS — I10 Essential (primary) hypertension: Secondary | ICD-10-CM | POA: Insufficient documentation

## 2019-01-24 DIAGNOSIS — Z3490 Encounter for supervision of normal pregnancy, unspecified, unspecified trimester: Secondary | ICD-10-CM | POA: Diagnosis not present

## 2019-01-24 DIAGNOSIS — O10919 Unspecified pre-existing hypertension complicating pregnancy, unspecified trimester: Secondary | ICD-10-CM | POA: Insufficient documentation

## 2019-01-24 DIAGNOSIS — O09523 Supervision of elderly multigravida, third trimester: Secondary | ICD-10-CM

## 2019-01-24 DIAGNOSIS — O99210 Obesity complicating pregnancy, unspecified trimester: Secondary | ICD-10-CM

## 2019-01-24 NOTE — Progress Notes (Signed)
Pt presents for ROB. Pt has no concerns today. 

## 2019-01-24 NOTE — Progress Notes (Signed)
PRENATAL VISIT NOTE  Subjective:  Miranda Barnett is a 43 y.o. G3P2002 at [redacted]w[redacted]d being seen today for ongoing prenatal care.  She is currently monitored for the following issues for this high-risk pregnancy and has Supervision of normal pregnancy, antepartum; AMA (advanced maternal age) multigravida 35+; Obesity in pregnancy; Unsp pre-existing diabetes in pregnancy, second trimester; History of 2 cesarean sections; and Chronic hypertension affecting pregnancy on their problem list.  Patient reports no bleeding, no contractions, no cramping and no leaking. No changes in discharge, except occasional fishy smell.  Contractions: Irritability. Vag. Bleeding: None.  Movement: Present. Denies leaking of fluid.   Miranda Barnett reports that her at home BP are normally in the 130s/80s. She was started on HCTZ after the birth of her second child (2 yrs ago), and takes is only "as needed", when she notices that her BP is high that day or that she is having swelling in her feet. She is concerned if this is safe in pregnancy. Denies hx of preeclampsia.   She also has a history of diabetes and is on insulin. Her fasting morning glucose is ~85, and lunchtime glucose is ~120. Her insulin regimen is: - 30 units Humalog and 38 units NPH in the morning - 28 units Humalog and 30 units NPH at bedtime  Denies lightheadedness, dizziness, vision changes, headache, chest pain, SOB, N/V, and swelling in hands.   The following portions of the patient's history were reviewed and updated as appropriate: allergies, current medications, past family history, past medical history, past social history, past surgical history and problem list.   Objective:   Vitals:   01/24/19 0843  BP: 135/84  Pulse: (!) 101  Weight: 257 lb 4.8 oz (116.7 kg)    Fetal Status: Fetal Heart Rate (bpm): 150   Movement: Present     General:  Alert, oriented and cooperative. Patient is in no acute distress.  Skin: Skin is warm and dry. No rash noted.    Cardiovascular: Normal heart rate noted, RRR, no murmurs  Respiratory: Normal respiratory effort, no problems with respiration noted  Abdomen: Soft, gravid, appropriate for gestational age.  Pain/Pressure: Absent     Pelvic: Cervical exam deferred        Extremities: Normal range of motion.  Edema: None  Mental Status: Normal mood and affect. Normal behavior. Normal judgment and thought content.   Assessment and Plan:  Pregnancy: G3P2002 at [redacted]w[redacted]d 1. Encounter for supervision of normal pregnancy, antepartum, unspecified gravidity - Pt has a hx of diabetes and blood sugars are well-controlled on current regimen (as noted in HPI). - Continue current insulin regimen.  - Continue monitoring and recording sugars.  - Discussed with pt that if her glucose goes above 120 PP/95 fasting, or she has increasing difficulty controlling sugars, that she should let us know.   - CBC - HIV Antibody (routine testing w rflx) - RPR - Tdap vaccine greater than or equal to 7yo IM  2. Chronic hypertension affecting pregnancy - At home BP run 130s/80s. No hx of preeclampsia. - Advised pt that HCTZ is safe in pregnancy as second line agent.  - Advised pt to take HCTZ daily. She has been taking it prn throughout pregnancy.  - Continue monitoring BP at home.   3. History of 2 cesarean sections Will need repeat C-section at 38 weeks or sooner based on control of co-morbidities.   4. Obesity in pregnancy - BMI 39.12  5. Multigravida of advanced maternal age in third trimester  Preterm  labor symptoms and general obstetric precautions including but not limited to vaginal bleeding, contractions, leaking of fluid and fetal movement were reviewed in detail with the patient. Please refer to After Visit Summary for other counseling recommendations.   No follow-ups on file.  Future Appointments  Date Time Provider Department Center  01/31/2019 10:10 AM [facility]-M[facility] NURSE [facility]-MFC MFC-US  01/31/2019 10:15 AM [facility]-MFC [place] 4  WH-MFCUS MFC-US    Calib Wadhwa  Riki Altes, Medical Student   Marcy Siren, D.O. [facility] Fellow, [facility], [facility] 01/24/2019, 11:07 AM

## 2019-01-25 LAB — CBC
Hematocrit: 35.8 % (ref 34.0–46.6)
Hemoglobin: 11.9 g/dL (ref 11.1–15.9)
MCH: 31 pg (ref 26.6–33.0)
MCHC: 33.2 g/dL (ref 31.5–35.7)
MCV: 93 fL (ref 79–97)
Platelets: 342 10*3/uL (ref 150–450)
RBC: 3.84 x10E6/uL (ref 3.77–5.28)
RDW: 13.3 % (ref 11.7–15.4)
WBC: 11.8 10*3/uL — ABNORMAL HIGH (ref 3.4–10.8)

## 2019-01-25 LAB — RPR: RPR Ser Ql: NONREACTIVE

## 2019-01-25 LAB — HIV ANTIBODY (ROUTINE TESTING W REFLEX): HIV Screen 4th Generation wRfx: NONREACTIVE

## 2019-01-31 ENCOUNTER — Encounter (HOSPITAL_COMMUNITY): Payer: Self-pay

## 2019-01-31 ENCOUNTER — Other Ambulatory Visit: Payer: Self-pay

## 2019-01-31 ENCOUNTER — Other Ambulatory Visit (HOSPITAL_COMMUNITY): Payer: Self-pay | Admitting: *Deleted

## 2019-01-31 ENCOUNTER — Ambulatory Visit (HOSPITAL_COMMUNITY): Payer: Medicaid Other | Admitting: *Deleted

## 2019-01-31 ENCOUNTER — Ambulatory Visit (HOSPITAL_COMMUNITY)
Admission: RE | Admit: 2019-01-31 | Discharge: 2019-01-31 | Disposition: A | Discharge status: Home / Self Care | Payer: Medicaid Other | Source: Ambulatory Visit | Attending: Obstetrics and Gynecology | Admitting: Obstetrics and Gynecology

## 2019-01-31 DIAGNOSIS — Z794 Long term (current) use of insulin: Secondary | ICD-10-CM | POA: Diagnosis not present

## 2019-01-31 DIAGNOSIS — O10013 Pre-existing essential hypertension complicating pregnancy, third trimester: Secondary | ICD-10-CM

## 2019-01-31 DIAGNOSIS — O34219 Maternal care for unspecified type scar from previous cesarean delivery: Secondary | ICD-10-CM

## 2019-01-31 DIAGNOSIS — O24312 Unspecified pre-existing diabetes mellitus in pregnancy, second trimester: Secondary | ICD-10-CM

## 2019-01-31 DIAGNOSIS — O24113 Pre-existing diabetes mellitus, type 2, in pregnancy, third trimester: Secondary | ICD-10-CM

## 2019-01-31 DIAGNOSIS — O09523 Supervision of elderly multigravida, third trimester: Secondary | ICD-10-CM | POA: Diagnosis not present

## 2019-01-31 DIAGNOSIS — O24313 Unspecified pre-existing diabetes mellitus in pregnancy, third trimester: Secondary | ICD-10-CM | POA: Diagnosis not present

## 2019-01-31 DIAGNOSIS — Z3A29 29 weeks gestation of pregnancy: Secondary | ICD-10-CM | POA: Insufficient documentation

## 2019-01-31 DIAGNOSIS — O99213 Obesity complicating pregnancy, third trimester: Secondary | ICD-10-CM

## 2019-01-31 DIAGNOSIS — Z362 Encounter for other antenatal screening follow-up: Secondary | ICD-10-CM | POA: Diagnosis not present

## 2019-01-31 DIAGNOSIS — IMO0001 Reserved for inherently not codable concepts without codable children: Secondary | ICD-10-CM

## 2019-01-31 DIAGNOSIS — O10919 Unspecified pre-existing hypertension complicating pregnancy, unspecified trimester: Secondary | ICD-10-CM

## 2019-01-31 DIAGNOSIS — O99210 Obesity complicating pregnancy, unspecified trimester: Secondary | ICD-10-CM

## 2019-02-07 ENCOUNTER — Ambulatory Visit (INDEPENDENT_AMBULATORY_CARE_PROVIDER_SITE_OTHER): Payer: Medicaid Other | Admitting: Nurse Practitioner

## 2019-02-07 ENCOUNTER — Other Ambulatory Visit: Payer: Self-pay

## 2019-02-07 VITALS — BP 136/82 | HR 101 | Wt 263.2 lb

## 2019-02-07 DIAGNOSIS — Z3A30 30 weeks gestation of pregnancy: Secondary | ICD-10-CM

## 2019-02-07 DIAGNOSIS — O09529 Supervision of elderly multigravida, unspecified trimester: Secondary | ICD-10-CM

## 2019-02-07 DIAGNOSIS — O10919 Unspecified pre-existing hypertension complicating pregnancy, unspecified trimester: Secondary | ICD-10-CM

## 2019-02-07 DIAGNOSIS — O0993 Supervision of high risk pregnancy, unspecified, third trimester: Secondary | ICD-10-CM

## 2019-02-07 DIAGNOSIS — O24313 Unspecified pre-existing diabetes mellitus in pregnancy, third trimester: Secondary | ICD-10-CM

## 2019-02-07 DIAGNOSIS — O09523 Supervision of elderly multigravida, third trimester: Secondary | ICD-10-CM

## 2019-02-07 DIAGNOSIS — O10913 Unspecified pre-existing hypertension complicating pregnancy, third trimester: Secondary | ICD-10-CM

## 2019-02-07 DIAGNOSIS — O24312 Unspecified pre-existing diabetes mellitus in pregnancy, second trimester: Secondary | ICD-10-CM

## 2019-02-07 DIAGNOSIS — O0990 Supervision of high risk pregnancy, unspecified, unspecified trimester: Secondary | ICD-10-CM

## 2019-02-07 MED ORDER — INSULIN NPH (HUMAN) (ISOPHANE) 100 UNIT/ML ~~LOC~~ SUSP: SUBCUTANEOUS | 3 refills | Status: DC

## 2019-02-07 MED ORDER — INSULIN LISPRO 100 UNIT/ML ~~LOC~~ SOLN: SUBCUTANEOUS | 11 refills | Status: DC

## 2019-02-07 NOTE — Progress Notes (Signed)
    Subjective:  Miranda Barnett is a 43 y.o. G3P2002 at [redacted]w[redacted]d being seen today for ongoing prenatal care.  She is currently monitored for the following issues for this high-risk pregnancy and has Supervision of high risk pregnancy, antepartum; AMA (advanced maternal age) multigravida 35+; Obesity in pregnancy; Unsp pre-existing diabetes in pregnancy, second trimester; History of 2 cesarean sections; and Chronic hypertension affecting pregnancy on their problem list.  Patient reports no complaints.  Contractions: Irritability. Vag. Bleeding: None.  Movement: Present. Denies leaking of fluid.   The following portions of the patient's history were reviewed and updated as appropriate: allergies, current medications, past family history, past medical history, past social history, past surgical history and problem list. Problem list updated.  Objective:   Vitals:   02/07/19 1024  BP: 136/82  Pulse: (!) 101  Weight: 263 lb 3.2 oz (119.4 kg)    Fetal Status: Fetal Heart Rate (bpm): 140 Fundal Height: 30 cm Movement: Present     General:  Alert, oriented and cooperative. Patient is in no acute distress.  Skin: Skin is warm and dry. No rash noted.   Cardiovascular: Normal heart rate noted  Respiratory: Normal respiratory effort, no problems with respiration noted  Abdomen: Soft, gravid, appropriate for gestational age. Pain/Pressure: Absent     Pelvic:  Cervical exam deferred        Extremities: Normal range of motion.  Edema: Trace  Mental Status: Normal mood and affect. Normal behavior. Normal judgment and thought content.   Urinalysis:      Assessment and Plan:  Pregnancy: G3P2002 at [redacted]w[redacted]d  1. Supervision of high risk pregnancy, antepartum Consult with Dr. Elly Modena for this high risk pregnancy Baby is moving well.   BPs reviewed in Babyscripts  2. Antepartum multigravida of advanced maternal age  64. Chronic hypertension affecting pregnanc Has been taking HCTZ through the pregnancy  but advised to stop. To continue low dose aspirin.  4. Unsp pre-existing diabetes in pregnancy, second trimester Has [place] for growth scheduled and beginning testing at 32 weeks. Reviewed Insulin at length.  Client did not bring food or glucose diary with her.  Encouraged to bring to her next visit. Reviewed insulin with Dr. Elly Modena. Chart updated with current insulin that client is using: Keep Insulin at current range: Lispro morning 34-36  Evening 30-32 NPH morning 38  Evening 34-36    Preterm labor symptoms and general obstetric precautions including but not limited to vaginal bleeding, contractions, leaking of fluid and fetal movement were reviewed in detail with the patient. Please refer to After Visit Summary for other counseling recommendations.  Return in about 2 weeks (around 02/21/2019) for in person [facility] with MD.  Earlie Server, RN, MSN, NP-BC Nurse Practitioner, [facility] for [facility], [place] 02/07/2019 1:18 PM

## 2019-02-07 NOTE — Progress Notes (Signed)
Pt presents for ROB. Pt has no concerns. Pt forgot to bring her blood sugar log, and has not been consistent with logging readings on babyrx.

## 2019-02-07 NOTE — Patient Instructions (Signed)
Stop the hydrochlorothiazide  Keep Insulin at current range: Lispro morning 34-36  Evening 30-32  NPH morning 38  Evening 34-36  Keep record of food intake and blood sugars and bring to your next appointment.

## 2019-02-21 ENCOUNTER — Other Ambulatory Visit (HOSPITAL_COMMUNITY): Payer: Self-pay | Admitting: Obstetrics

## 2019-02-21 ENCOUNTER — Encounter (HOSPITAL_COMMUNITY): Payer: Self-pay

## 2019-02-21 ENCOUNTER — Other Ambulatory Visit: Payer: Self-pay

## 2019-02-21 ENCOUNTER — Ambulatory Visit (HOSPITAL_COMMUNITY)
Admission: RE | Admit: 2019-02-21 | Discharge: 2019-02-21 | Disposition: A | Discharge status: Home / Self Care | Payer: Medicaid Other | Source: Ambulatory Visit | Attending: Obstetrics and Gynecology | Admitting: Obstetrics and Gynecology

## 2019-02-21 ENCOUNTER — Ambulatory Visit (INDEPENDENT_AMBULATORY_CARE_PROVIDER_SITE_OTHER): Payer: Medicaid Other | Admitting: Obstetrics and Gynecology

## 2019-02-21 ENCOUNTER — Encounter: Payer: Self-pay | Admitting: Obstetrics and Gynecology

## 2019-02-21 ENCOUNTER — Ambulatory Visit (HOSPITAL_COMMUNITY): Payer: Medicaid Other | Admitting: *Deleted

## 2019-02-21 VITALS — BP 131/84 | HR 101 | Wt 270.3 lb

## 2019-02-21 DIAGNOSIS — Z3A32 32 weeks gestation of pregnancy: Secondary | ICD-10-CM

## 2019-02-21 DIAGNOSIS — O10913 Unspecified pre-existing hypertension complicating pregnancy, third trimester: Secondary | ICD-10-CM

## 2019-02-21 DIAGNOSIS — O10919 Unspecified pre-existing hypertension complicating pregnancy, unspecified trimester: Secondary | ICD-10-CM | POA: Diagnosis not present

## 2019-02-21 DIAGNOSIS — Z3009 Encounter for other general counseling and advice on contraception: Secondary | ICD-10-CM | POA: Insufficient documentation

## 2019-02-21 DIAGNOSIS — O0990 Supervision of high risk pregnancy, unspecified, unspecified trimester: Secondary | ICD-10-CM

## 2019-02-21 DIAGNOSIS — O0993 Supervision of high risk pregnancy, unspecified, third trimester: Secondary | ICD-10-CM

## 2019-02-21 DIAGNOSIS — O09523 Supervision of elderly multigravida, third trimester: Secondary | ICD-10-CM

## 2019-02-21 DIAGNOSIS — O24312 Unspecified pre-existing diabetes mellitus in pregnancy, second trimester: Secondary | ICD-10-CM | POA: Insufficient documentation

## 2019-02-21 DIAGNOSIS — O24113 Pre-existing diabetes mellitus, type 2, in pregnancy, third trimester: Secondary | ICD-10-CM

## 2019-02-21 DIAGNOSIS — O24313 Unspecified pre-existing diabetes mellitus in pregnancy, third trimester: Secondary | ICD-10-CM

## 2019-02-21 DIAGNOSIS — O24119 Pre-existing diabetes mellitus, type 2, in pregnancy, unspecified trimester: Secondary | ICD-10-CM

## 2019-02-21 DIAGNOSIS — O10013 Pre-existing essential hypertension complicating pregnancy, third trimester: Secondary | ICD-10-CM

## 2019-02-21 DIAGNOSIS — O99213 Obesity complicating pregnancy, third trimester: Secondary | ICD-10-CM

## 2019-02-21 DIAGNOSIS — O99210 Obesity complicating pregnancy, unspecified trimester: Secondary | ICD-10-CM | POA: Diagnosis not present

## 2019-02-21 DIAGNOSIS — O34219 Maternal care for unspecified type scar from previous cesarean delivery: Secondary | ICD-10-CM | POA: Diagnosis not present

## 2019-02-21 DIAGNOSIS — Z98891 History of uterine scar from previous surgery: Secondary | ICD-10-CM

## 2019-02-21 NOTE — Progress Notes (Signed)
   PRENATAL VISIT NOTE  Subjective:  Miranda Barnett is a 43 y.o. G3P2002 at [redacted]w[redacted]d being seen today for ongoing prenatal care.  She is currently monitored for the following issues for this high-risk pregnancy and has Supervision of high risk pregnancy, antepartum; AMA (advanced maternal age) multigravida 35+; Obesity in pregnancy; Unsp pre-existing diabetes in pregnancy, second trimester; History of 2 cesarean sections; Chronic hypertension affecting pregnancy; and Unwanted fertility on their problem list.  Patient reports no complaints.  Contractions: Irritability. Vag. Bleeding: None.  Movement: Present. Denies leaking of fluid.   The following portions of the patient's history were reviewed and updated as appropriate: allergies, current medications, past family history, past medical history, past social history, past surgical history and problem list.   Objective:   Vitals:   02/21/19 0858  BP: 131/84  Pulse: (!) 101  Weight: 270 lb 4.8 oz (122.6 kg)    Fetal Status: Fetal Heart Rate (bpm): 136   Movement: Present     General:  Alert, oriented and cooperative. Patient is in no acute distress.  Skin: Skin is warm and dry. No rash noted.   Cardiovascular: Normal heart rate noted  Respiratory: Normal respiratory effort, no problems with respiration noted  Abdomen: Soft, gravid, appropriate for gestational age.  Pain/Pressure: Present     Pelvic: Cervical exam deferred        Extremities: Normal range of motion.  Edema: Trace  Mental Status: Normal mood and affect. Normal behavior. Normal judgment and thought content.   Assessment and Plan:  Pregnancy: G3P2002 at [redacted]w[redacted]d  1. Supervision of high risk pregnancy, antepartum  2. Chronic hypertension affecting pregnancy Cont baby ASA Stopped HCTZ per last visit BP stable today  3. Unsp pre-existing diabetes in pregnancy, second trimester NPH 38/36 Lispro 36/32 FG: all < 95 PP: all < 120 To start weekly testing this week, has BPP  today  4. Multigravida of advanced maternal age in third trimester  5. History of 2 cesarean sections RCS requested today  6. Unwanted fertility BTL paper signed today Still considering  Preterm labor symptoms and general obstetric precautions including but not limited to vaginal bleeding, contractions, leaking of fluid and fetal movement were reviewed in detail with the patient. Please refer to After Visit Summary for other counseling recommendations.   Return in about 2 weeks (around 03/07/2019) for high OB, in person.  Future Appointments  Date Time Provider [place]  02/21/2019 10:15 AM [place] 4 WH-MFCUS MFC-US  02/21/2019 10:20 AM [place] NURSE [place] MFC-US  02/28/2019 11:00 AM [place] NURSE [place] MFC-US  02/28/2019 11:00 AM [place] 3 WH-MFCUS MFC-US    Sloan Leiter, MD

## 2019-02-21 NOTE — Progress Notes (Signed)
Patient reports fetal movement with occasional back pain. Pt states fasting BG today was 87, pt has log with her in office today. Pt wants to discuss BTL today.

## 2019-02-21 NOTE — Procedures (Signed)
Isabel Howat 1975/11/16 [redacted]w[redacted]d  Fetus A Non-Stress Test Interpretation for 02/21/19  Indication: Diabetes  Fetal Heart Rate A Mode: External Baseline Rate (A): 140 bpm Variability: Moderate Accelerations: 15 x 15 Decelerations: None Multiple birth?: No  Uterine Activity Mode: Palpation, Toco Contraction Frequency (min): none Resting Tone Palpated: Relaxed Resting Time: Adequate  Interpretation (Fetal Testing) Nonstress Test Interpretation: Reactive Comments: Reviewed tracing with Dr. Annamaria Boots

## 2019-02-28 ENCOUNTER — Encounter (HOSPITAL_COMMUNITY): Payer: Self-pay | Admitting: *Deleted

## 2019-02-28 ENCOUNTER — Other Ambulatory Visit (HOSPITAL_COMMUNITY): Payer: Self-pay | Admitting: *Deleted

## 2019-02-28 ENCOUNTER — Ambulatory Visit (HOSPITAL_COMMUNITY)
Admission: RE | Admit: 2019-02-28 | Discharge: 2019-02-28 | Disposition: A | Discharge status: Home / Self Care | Payer: Medicaid Other | Source: Ambulatory Visit | Attending: Obstetrics and Gynecology | Admitting: Obstetrics and Gynecology

## 2019-02-28 ENCOUNTER — Other Ambulatory Visit: Payer: Self-pay

## 2019-02-28 ENCOUNTER — Ambulatory Visit (HOSPITAL_COMMUNITY): Payer: Medicaid Other | Admitting: *Deleted

## 2019-02-28 DIAGNOSIS — O10919 Unspecified pre-existing hypertension complicating pregnancy, unspecified trimester: Secondary | ICD-10-CM | POA: Insufficient documentation

## 2019-02-28 DIAGNOSIS — O34219 Maternal care for unspecified type scar from previous cesarean delivery: Secondary | ICD-10-CM

## 2019-02-28 DIAGNOSIS — Z3A33 33 weeks gestation of pregnancy: Secondary | ICD-10-CM | POA: Diagnosis not present

## 2019-02-28 DIAGNOSIS — O99213 Obesity complicating pregnancy, third trimester: Secondary | ICD-10-CM

## 2019-02-28 DIAGNOSIS — O24113 Pre-existing diabetes mellitus, type 2, in pregnancy, third trimester: Secondary | ICD-10-CM | POA: Diagnosis not present

## 2019-02-28 DIAGNOSIS — O99210 Obesity complicating pregnancy, unspecified trimester: Secondary | ICD-10-CM | POA: Diagnosis not present

## 2019-02-28 DIAGNOSIS — O09523 Supervision of elderly multigravida, third trimester: Secondary | ICD-10-CM

## 2019-02-28 DIAGNOSIS — O10013 Pre-existing essential hypertension complicating pregnancy, third trimester: Secondary | ICD-10-CM

## 2019-02-28 DIAGNOSIS — O24312 Unspecified pre-existing diabetes mellitus in pregnancy, second trimester: Secondary | ICD-10-CM

## 2019-03-07 ENCOUNTER — Other Ambulatory Visit: Payer: Self-pay

## 2019-03-07 ENCOUNTER — Ambulatory Visit (INDEPENDENT_AMBULATORY_CARE_PROVIDER_SITE_OTHER): Payer: Medicaid Other | Admitting: Obstetrics and Gynecology

## 2019-03-07 ENCOUNTER — Encounter: Payer: Self-pay | Admitting: Obstetrics and Gynecology

## 2019-03-07 VITALS — BP 136/84 | HR 99 | Wt 271.8 lb

## 2019-03-07 DIAGNOSIS — O09523 Supervision of elderly multigravida, third trimester: Secondary | ICD-10-CM

## 2019-03-07 DIAGNOSIS — O0993 Supervision of high risk pregnancy, unspecified, third trimester: Secondary | ICD-10-CM

## 2019-03-07 DIAGNOSIS — O0990 Supervision of high risk pregnancy, unspecified, unspecified trimester: Secondary | ICD-10-CM

## 2019-03-07 DIAGNOSIS — Z98891 History of uterine scar from previous surgery: Secondary | ICD-10-CM

## 2019-03-07 DIAGNOSIS — Z3009 Encounter for other general counseling and advice on contraception: Secondary | ICD-10-CM

## 2019-03-07 DIAGNOSIS — Z3A34 34 weeks gestation of pregnancy: Secondary | ICD-10-CM

## 2019-03-07 DIAGNOSIS — O10919 Unspecified pre-existing hypertension complicating pregnancy, unspecified trimester: Secondary | ICD-10-CM

## 2019-03-07 NOTE — Patient Instructions (Signed)
Third Trimester of Pregnancy The third trimester is from week 28 through week 40 (months 7 through 9). The third trimester is a time when the unborn baby (fetus) is growing rapidly. At the end of the ninth month, the fetus is about 20 inches in length and weighs 6-10 pounds. Body changes during your third trimester Your body will continue to go through many changes during pregnancy. The changes vary from woman to woman. During the third trimester:  Your weight will continue to increase. You can expect to gain 25-35 pounds (11-16 kg) by the end of the pregnancy.  You may begin to get stretch marks on your hips, abdomen, and breasts.  You may urinate more often because the fetus is moving lower into your pelvis and pressing on your bladder.  You may develop or continue to have heartburn. This is caused by increased hormones that slow down muscles in the digestive tract.  You may develop or continue to have constipation because increased hormones slow digestion and cause the muscles that push waste through your intestines to relax.  You may develop hemorrhoids. These are swollen veins (varicose veins) in the rectum that can itch or be painful.  You may develop swollen, bulging veins (varicose veins) in your legs.  You may have increased body aches in the pelvis, back, or thighs. This is due to weight gain and increased hormones that are relaxing your joints.  You may have changes in your hair. These can include thickening of your hair, rapid growth, and changes in texture. Some women also have hair loss during or after pregnancy, or hair that feels dry or thin. Your hair will most likely return to normal after your baby is born.  Your breasts will continue to grow and they will continue to become tender. A yellow fluid (colostrum) may leak from your breasts. This is the first milk you are producing for your baby.  Your belly button may stick out.  You may notice more swelling in your hands,  face, or ankles.  You may have increased tingling or numbness in your hands, arms, and legs. The skin on your belly may also feel numb.  You may feel short of breath because of your expanding uterus.  You may have more problems sleeping. This can be caused by the size of your belly, increased need to urinate, and an increase in your body's metabolism.  You may notice the fetus "dropping," or moving lower in your abdomen (lightening).  You may have increased vaginal discharge.  You may notice your joints feel loose and you may have pain around your pelvic bone. What to expect at prenatal visits You will have prenatal exams every 2 weeks until week 36. Then you will have weekly prenatal exams. During a routine prenatal visit:  You will be weighed to make sure you and the baby are growing normally.  Your blood pressure will be taken.  Your abdomen will be measured to track your baby's growth.  The fetal heartbeat will be listened to.  Any test results from the previous visit will be discussed.  You may have a cervical check near your due date to see if your cervix has softened or thinned (effaced).  You will be tested for Group B streptococcus. This happens between 35 and 37 weeks. Your health care provider may ask you:  What your birth plan is.  How you are feeling.  If you are feeling the baby move.  If you have had any abnormal   symptoms, such as leaking fluid, bleeding, severe headaches, or abdominal cramping.  If you are using any tobacco products, including cigarettes, chewing tobacco, and electronic cigarettes.  If you have any questions. Other tests or screenings that may be performed during your third trimester include:  Blood tests that check for low iron levels (anemia).  Fetal testing to check the health, activity level, and growth of the fetus. Testing is done if you have certain medical conditions or if there are problems during the pregnancy.  Nonstress test  (NST). This test checks the health of your baby to make sure there are no signs of problems, such as the baby not getting enough oxygen. During this test, a belt is placed around your belly. The baby is made to move, and its heart rate is monitored during movement. What is false labor? False labor is a condition in which you feel small, irregular tightenings of the muscles in the womb (contractions) that usually go away with rest, changing position, or drinking water. These are called Braxton Hicks contractions. Contractions may last for hours, days, or even weeks before true labor sets in. If contractions come at regular intervals, become more frequent, increase in intensity, or become painful, you should see your health care provider. What are the signs of labor?  Abdominal cramps.  Regular contractions that start at 10 minutes apart and become stronger and more frequent with time.  Contractions that start on the top of the uterus and spread down to the lower abdomen and back.  Increased pelvic pressure and dull back pain.  A watery or bloody mucus discharge that comes from the vagina.  Leaking of amniotic fluid. This is also known as your "water breaking." It could be a slow trickle or a gush. Let your health care provider know if it has a color or strange odor. If you have any of these signs, call your health care provider right away, even if it is before your due date. Follow these instructions at home: Medicines  Follow your health care provider's instructions regarding medicine use. Specific medicines may be either safe or unsafe to take during pregnancy.  Take a prenatal vitamin that contains at least 600 micrograms (mcg) of folic acid.  If you develop constipation, try taking a stool softener if your health care provider approves. Eating and drinking   Eat a balanced diet that includes fresh fruits and vegetables, whole grains, good sources of protein such as meat, eggs, or tofu,  and low-fat dairy. Your health care provider will help you determine the amount of weight gain that is right for you.  Avoid raw meat and uncooked cheese. These carry germs that can cause birth defects in the baby.  If you have low calcium intake from food, talk to your health care provider about whether you should take a daily calcium supplement.  Eat four or five small meals rather than three large meals a day.  Limit foods that are high in fat and processed sugars, such as fried and sweet foods.  To prevent constipation: ? Drink enough fluid to keep your urine clear or pale yellow. ? Eat foods that are high in fiber, such as fresh fruits and vegetables, whole grains, and beans. Activity  Exercise only as directed by your health care provider. Most women can continue their usual exercise routine during pregnancy. Try to exercise for 30 minutes at least 5 days a week. Stop exercising if you experience uterine contractions.  Avoid heavy lifting.  Do   not exercise in extreme heat or humidity, or at high altitudes.  Wear low-heel, comfortable shoes.  Practice good posture.  You may continue to have sex unless your health care provider tells you otherwise. Relieving pain and discomfort  Take frequent breaks and rest with your legs elevated if you have leg cramps or low back pain.  Take warm sitz baths to soothe any pain or discomfort caused by hemorrhoids. Use hemorrhoid cream if your health care provider approves.  Wear a good support bra to prevent discomfort from breast tenderness.  If you develop varicose veins: ? Wear support pantyhose or compression stockings as told by your healthcare provider. ? Elevate your feet for 15 minutes, 3-4 times a day. Prenatal care  Write down your questions. Take them to your prenatal visits.  Keep all your prenatal visits as told by your health care provider. This is important. Safety  Wear your seat belt at all times when driving.  Make  a list of emergency phone numbers, including numbers for family, friends, the hospital, and police and fire departments. General instructions  Avoid cat litter boxes and soil used by cats. These carry germs that can cause birth defects in the baby. If you have a cat, ask someone to clean the litter box for you.  Do not travel far distances unless it is absolutely necessary and only with the approval of your health care provider.  Do not use hot tubs, steam rooms, or saunas.  Do not drink alcohol.  Do not use any products that contain nicotine or tobacco, such as cigarettes and e-cigarettes. If you need help quitting, ask your health care provider.  Do not use any medicinal herbs or unprescribed drugs. These chemicals affect the formation and growth of the baby.  Do not douche or use tampons or scented sanitary pads.  Do not cross your legs for long periods of time.  To prepare for the arrival of your baby: ? Take prenatal classes to understand, practice, and ask questions about labor and delivery. ? Make a trial run to the hospital. ? Visit the hospital and tour the maternity area. ? Arrange for maternity or paternity leave through employers. ? Arrange for family and friends to take care of pets while you are in the hospital. ? Purchase a rear-facing car seat and make sure you know how to install it in your car. ? Pack your hospital bag. ? Prepare the baby's nursery. Make sure to remove all pillows and stuffed animals from the baby's crib to prevent suffocation.  Visit your dentist if you have not gone during your pregnancy. Use a soft toothbrush to brush your teeth and be gentle when you floss. Contact a health care provider if:  You are unsure if you are in labor or if your water has broken.  You become dizzy.  You have mild pelvic cramps, pelvic pressure, or nagging pain in your abdominal area.  You have lower back pain.  You have persistent nausea, vomiting, or diarrhea.   You have an unusual or bad smelling vaginal discharge.  You have pain when you urinate. Get help right away if:  Your water breaks before 37 weeks.  You have regular contractions less than 5 minutes apart before 37 weeks.  You have a fever.  You are leaking fluid from your vagina.  You have spotting or bleeding from your vagina.  You have severe abdominal pain or cramping.  You have rapid weight loss or weight gain.  You have   shortness of breath with chest pain.  You notice sudden or extreme swelling of your face, hands, ankles, feet, or legs.  Your baby makes fewer than 10 movements in 2 hours.  You have severe headaches that do not go away when you take medicine.  You have vision changes. Summary  The third trimester is from week 28 through week 40, months 7 through 9. The third trimester is a time when the unborn baby (fetus) is growing rapidly.  During the third trimester, your discomfort may increase as you and your baby continue to gain weight. You may have abdominal, leg, and back pain, sleeping problems, and an increased need to urinate.  During the third trimester your breasts will keep growing and they will continue to become tender. A yellow fluid (colostrum) may leak from your breasts. This is the first milk you are producing for your baby.  False labor is a condition in which you feel small, irregular tightenings of the muscles in the womb (contractions) that eventually go away. These are called Braxton Hicks contractions. Contractions may last for hours, days, or even weeks before true labor sets in.  Signs of labor can include: abdominal cramps; regular contractions that start at 10 minutes apart and become stronger and more frequent with time; watery or bloody mucus discharge that comes from the vagina; increased pelvic pressure and dull back pain; and leaking of amniotic fluid. This information is not intended to replace advice given to you by your health  care provider. Make sure you discuss any questions you have with your health care provider. Document Released: 04/12/2001 Document Revised: 08/09/2018 Document Reviewed: 05/24/2016 Elsevier Patient Education  2020 [employer]  

## 2019-03-07 NOTE — Progress Notes (Signed)
Patient reports good fetal movement with irregular contractions. Pt states that she forgot her BG logs at home, reports yesterday's fasting as 87, and 115 after lunch.

## 2019-03-07 NOTE — Progress Notes (Signed)
Subjective:  Miranda Barnett is a 43 y.o. G3P2002 at [redacted]w[redacted]d being seen today for ongoing prenatal care.  She is currently monitored for the following issues for this high-risk pregnancy and has Supervision of high risk pregnancy, antepartum; AMA (advanced maternal age) multigravida 35+; Obesity in pregnancy; Unsp pre-existing diabetes in pregnancy, second trimester; History of 2 cesarean sections; Chronic hypertension affecting pregnancy; and Unwanted fertility on their problem list.  Patient reports general discomforts of pregnancy.  Contractions: Irregular. Vag. Bleeding: None.  Movement: Present. Denies leaking of fluid.   The following portions of the patient's history were reviewed and updated as appropriate: allergies, current medications, past family history, past medical history, past social history, past surgical history and problem list. Problem list updated.  Objective:   Vitals:   03/07/19 0922  BP: 136/84  Pulse: 99  Weight: 271 lb 12.8 oz (123.3 kg)    Fetal Status: Fetal Heart Rate (bpm): 148   Movement: Present     General:  Alert, oriented and cooperative. Patient is in no acute distress.  Skin: Skin is warm and dry. No rash noted.   Cardiovascular: Normal heart rate noted  Respiratory: Normal respiratory effort, no problems with respiration noted  Abdomen: Soft, gravid, appropriate for gestational age. Pain/Pressure: Present     Pelvic:  Cervical exam deferred        Extremities: Normal range of motion.  Edema: Trace  Mental Status: Normal mood and affect. Normal behavior. Normal judgment and thought content.   Urinalysis:      Assessment and Plan:  Pregnancy: G3P2002 at [redacted]w[redacted]d  1. Supervision of high risk pregnancy, antepartum Stable GBS next visit  2. Multigravida of advanced maternal age in third trimester LR NIPS  3. Chronic hypertension affecting pregnancy BP stable without meds Continue to monitor BP at home and qd BASA Weekly BPP's Growth 65% on  02/28/19  4. History of 2 cesarean sections For repeat 04/11/19   5. Unwanted fertility BTL papers signed  6.DM CBG's in goal range with current insulin regiment Will continue Weekly BPP and growth scan as noted above  Preterm labor symptoms and general obstetric precautions including but not limited to vaginal bleeding, contractions, leaking of fluid and fetal movement were reviewed in detail with the patient. Please refer to After Visit Summary for other counseling recommendations.  Return in about 2 weeks (around 03/21/2019) for OB visit, face to face, GBS.   Chancy Milroy, MD

## 2019-03-08 ENCOUNTER — Ambulatory Visit (HOSPITAL_COMMUNITY): Payer: Medicaid Other | Admitting: *Deleted

## 2019-03-08 ENCOUNTER — Encounter (HOSPITAL_COMMUNITY): Payer: Self-pay

## 2019-03-08 ENCOUNTER — Ambulatory Visit (HOSPITAL_COMMUNITY)
Admission: RE | Admit: 2019-03-08 | Discharge: 2019-03-08 | Disposition: A | Discharge status: Home / Self Care | Payer: Medicaid Other | Source: Ambulatory Visit | Attending: Obstetrics and Gynecology | Admitting: Obstetrics and Gynecology

## 2019-03-08 DIAGNOSIS — O10919 Unspecified pre-existing hypertension complicating pregnancy, unspecified trimester: Secondary | ICD-10-CM | POA: Insufficient documentation

## 2019-03-08 DIAGNOSIS — O99210 Obesity complicating pregnancy, unspecified trimester: Secondary | ICD-10-CM | POA: Insufficient documentation

## 2019-03-08 DIAGNOSIS — O09523 Supervision of elderly multigravida, third trimester: Secondary | ICD-10-CM | POA: Diagnosis not present

## 2019-03-08 DIAGNOSIS — O34219 Maternal care for unspecified type scar from previous cesarean delivery: Secondary | ICD-10-CM

## 2019-03-08 DIAGNOSIS — O24312 Unspecified pre-existing diabetes mellitus in pregnancy, second trimester: Secondary | ICD-10-CM | POA: Insufficient documentation

## 2019-03-08 DIAGNOSIS — O10013 Pre-existing essential hypertension complicating pregnancy, third trimester: Secondary | ICD-10-CM

## 2019-03-08 DIAGNOSIS — O99213 Obesity complicating pregnancy, third trimester: Secondary | ICD-10-CM | POA: Diagnosis not present

## 2019-03-08 DIAGNOSIS — Z3A34 34 weeks gestation of pregnancy: Secondary | ICD-10-CM

## 2019-03-08 DIAGNOSIS — O24113 Pre-existing diabetes mellitus, type 2, in pregnancy, third trimester: Secondary | ICD-10-CM

## 2019-03-15 ENCOUNTER — Other Ambulatory Visit: Payer: Self-pay

## 2019-03-15 ENCOUNTER — Ambulatory Visit (HOSPITAL_COMMUNITY): Payer: Medicaid Other | Admitting: *Deleted

## 2019-03-15 ENCOUNTER — Ambulatory Visit (HOSPITAL_COMMUNITY)
Admission: RE | Admit: 2019-03-15 | Discharge: 2019-03-15 | Disposition: A | Discharge status: Home / Self Care | Payer: Medicaid Other | Source: Ambulatory Visit | Attending: Obstetrics and Gynecology | Admitting: Obstetrics and Gynecology

## 2019-03-15 ENCOUNTER — Encounter (HOSPITAL_COMMUNITY): Payer: Self-pay

## 2019-03-15 DIAGNOSIS — O24312 Unspecified pre-existing diabetes mellitus in pregnancy, second trimester: Secondary | ICD-10-CM | POA: Insufficient documentation

## 2019-03-15 DIAGNOSIS — O99213 Obesity complicating pregnancy, third trimester: Secondary | ICD-10-CM | POA: Diagnosis not present

## 2019-03-15 DIAGNOSIS — O09523 Supervision of elderly multigravida, third trimester: Secondary | ICD-10-CM | POA: Diagnosis not present

## 2019-03-15 DIAGNOSIS — O34219 Maternal care for unspecified type scar from previous cesarean delivery: Secondary | ICD-10-CM

## 2019-03-15 DIAGNOSIS — O10013 Pre-existing essential hypertension complicating pregnancy, third trimester: Secondary | ICD-10-CM | POA: Diagnosis not present

## 2019-03-15 DIAGNOSIS — O10919 Unspecified pre-existing hypertension complicating pregnancy, unspecified trimester: Secondary | ICD-10-CM | POA: Diagnosis not present

## 2019-03-15 DIAGNOSIS — O24113 Pre-existing diabetes mellitus, type 2, in pregnancy, third trimester: Secondary | ICD-10-CM

## 2019-03-15 DIAGNOSIS — O99210 Obesity complicating pregnancy, unspecified trimester: Secondary | ICD-10-CM | POA: Insufficient documentation

## 2019-03-15 DIAGNOSIS — Z3A35 35 weeks gestation of pregnancy: Secondary | ICD-10-CM | POA: Diagnosis not present

## 2019-03-21 ENCOUNTER — Encounter: Payer: Self-pay | Admitting: Obstetrics and Gynecology

## 2019-03-21 ENCOUNTER — Ambulatory Visit (INDEPENDENT_AMBULATORY_CARE_PROVIDER_SITE_OTHER): Payer: Medicaid Other | Admitting: Obstetrics and Gynecology

## 2019-03-21 ENCOUNTER — Other Ambulatory Visit: Payer: Self-pay

## 2019-03-21 ENCOUNTER — Other Ambulatory Visit (HOSPITAL_COMMUNITY)
Admission: RE | Admit: 2019-03-21 | Discharge: 2019-03-21 | Disposition: A | Discharge status: Home / Self Care | Payer: Medicaid Other | Source: Ambulatory Visit | Attending: Obstetrics and Gynecology | Admitting: Obstetrics and Gynecology

## 2019-03-21 VITALS — BP 128/84 | HR 92 | Wt 274.0 lb

## 2019-03-21 DIAGNOSIS — O10913 Unspecified pre-existing hypertension complicating pregnancy, third trimester: Secondary | ICD-10-CM

## 2019-03-21 DIAGNOSIS — Z3A36 36 weeks gestation of pregnancy: Secondary | ICD-10-CM

## 2019-03-21 DIAGNOSIS — O0993 Supervision of high risk pregnancy, unspecified, third trimester: Secondary | ICD-10-CM

## 2019-03-21 DIAGNOSIS — O0990 Supervision of high risk pregnancy, unspecified, unspecified trimester: Secondary | ICD-10-CM

## 2019-03-21 DIAGNOSIS — O24313 Unspecified pre-existing diabetes mellitus in pregnancy, third trimester: Secondary | ICD-10-CM

## 2019-03-21 DIAGNOSIS — Z3009 Encounter for other general counseling and advice on contraception: Secondary | ICD-10-CM

## 2019-03-21 DIAGNOSIS — O09523 Supervision of elderly multigravida, third trimester: Secondary | ICD-10-CM

## 2019-03-21 DIAGNOSIS — O24312 Unspecified pre-existing diabetes mellitus in pregnancy, second trimester: Secondary | ICD-10-CM

## 2019-03-21 DIAGNOSIS — Z98891 History of uterine scar from previous surgery: Secondary | ICD-10-CM

## 2019-03-21 DIAGNOSIS — O10919 Unspecified pre-existing hypertension complicating pregnancy, unspecified trimester: Secondary | ICD-10-CM

## 2019-03-21 NOTE — Progress Notes (Signed)
   PRENATAL VISIT NOTE  Subjective:  Miranda Barnett is a 43 y.o. G3P2002 at [redacted]w[redacted]d being seen today for ongoing prenatal care.  She is currently monitored for the following issues for this high-risk pregnancy and has Supervision of high risk pregnancy, antepartum; AMA (advanced maternal age) multigravida 35+; Obesity in pregnancy; Unsp pre-existing diabetes in pregnancy, second trimester; History of 2 cesarean sections; Chronic hypertension affecting pregnancy; and Unwanted fertility on their problem list.  Patient reports feeling large, small amount of ankle swelling.  Contractions: Irregular. Vag. Bleeding: None.  Movement: Present. Denies leaking of fluid.   The following portions of the patient's history were reviewed and updated as appropriate: allergies, current medications, past family history, past medical history, past social history, past surgical history and problem list.   Objective:   Vitals:   03/21/19 1123  BP: 128/84  Pulse: 92  Weight: 274 lb (124.3 kg)    Fetal Status: Fetal Heart Rate (bpm): 148   Movement: Present     General:  Alert, oriented and cooperative. Patient is in no acute distress.  Skin: Skin is warm and dry. No rash noted.   Cardiovascular: Normal heart rate noted  Respiratory: Normal respiratory effort, no problems with respiration noted  Abdomen: Soft, gravid, appropriate for gestational age.  Pain/Pressure: Present     Pelvic: Cervical exam deferred        Extremities: Normal range of motion.  Edema: Trace  Mental Status: Normal mood and affect. Normal behavior. Normal judgment and thought content.   Assessment and Plan:  Pregnancy: G3P2002 at [redacted]w[redacted]d  1. Supervision of high risk pregnancy, antepartum  2. Multigravida of advanced maternal age in third trimester  3. History of 2 cesarean sections RCS scheduled for 39 weeks  4. Unwanted fertility Undecided about BTL Papers have been signed  5. Unsp pre-existing diabetes in pregnancy, second  trimester NPH 38/36 Humalog 36/32 FG: 80s PP: 110s BPP tomorrow  6. Chronic hypertension affecting pregnancy Stable, no meds Cont baby ASA  Preterm labor symptoms and general obstetric precautions including but not limited to vaginal bleeding, contractions, leaking of fluid and fetal movement were reviewed in detail with the patient. Please refer to After Visit Summary for other counseling recommendations.   Return in about 1 week (around 03/28/2019) for high OB, in person.  Future Appointments  Date Time Provider [place]  03/22/2019  8:45 AM [place] NURSE [place] MFC-US  03/22/2019  8:45 AM [place] 2 WH-MFCUS MFC-US  03/26/2019  9:15 AM Chancy Milroy, MD [place] None  04/01/2019 11:00 AM [place] NURSE WH-MFC MFC-US  04/01/2019 11:00 AM [place] 3 WH-MFCUS MFC-US    Sloan Leiter, MD

## 2019-03-22 ENCOUNTER — Ambulatory Visit (HOSPITAL_COMMUNITY): Payer: Medicaid Other | Admitting: *Deleted

## 2019-03-22 ENCOUNTER — Ambulatory Visit (HOSPITAL_COMMUNITY)
Admission: RE | Admit: 2019-03-22 | Discharge: 2019-03-22 | Disposition: A | Discharge status: Home / Self Care | Payer: Medicaid Other | Source: Ambulatory Visit | Attending: Obstetrics and Gynecology | Admitting: Obstetrics and Gynecology

## 2019-03-22 ENCOUNTER — Encounter (HOSPITAL_COMMUNITY): Payer: Self-pay

## 2019-03-22 DIAGNOSIS — O10013 Pre-existing essential hypertension complicating pregnancy, third trimester: Secondary | ICD-10-CM | POA: Diagnosis not present

## 2019-03-22 DIAGNOSIS — O10919 Unspecified pre-existing hypertension complicating pregnancy, unspecified trimester: Secondary | ICD-10-CM | POA: Diagnosis not present

## 2019-03-22 DIAGNOSIS — O09523 Supervision of elderly multigravida, third trimester: Secondary | ICD-10-CM | POA: Diagnosis not present

## 2019-03-22 DIAGNOSIS — O24312 Unspecified pre-existing diabetes mellitus in pregnancy, second trimester: Secondary | ICD-10-CM

## 2019-03-22 DIAGNOSIS — O34219 Maternal care for unspecified type scar from previous cesarean delivery: Secondary | ICD-10-CM

## 2019-03-22 DIAGNOSIS — O99210 Obesity complicating pregnancy, unspecified trimester: Secondary | ICD-10-CM | POA: Diagnosis not present

## 2019-03-22 DIAGNOSIS — O99213 Obesity complicating pregnancy, third trimester: Secondary | ICD-10-CM | POA: Diagnosis not present

## 2019-03-22 DIAGNOSIS — Z3A36 36 weeks gestation of pregnancy: Secondary | ICD-10-CM

## 2019-03-22 DIAGNOSIS — O24113 Pre-existing diabetes mellitus, type 2, in pregnancy, third trimester: Secondary | ICD-10-CM

## 2019-03-22 LAB — CERVICOVAGINAL ANCILLARY ONLY
Chlamydia: NEGATIVE
Comment: NEGATIVE
Comment: NORMAL
Neisseria Gonorrhea: NEGATIVE

## 2019-03-23 LAB — STREP GP B NAA: Strep Gp B NAA: POSITIVE — AB

## 2019-03-26 ENCOUNTER — Encounter: Payer: Self-pay | Admitting: Obstetrics and Gynecology

## 2019-03-26 ENCOUNTER — Other Ambulatory Visit: Payer: Self-pay

## 2019-03-26 ENCOUNTER — Ambulatory Visit (INDEPENDENT_AMBULATORY_CARE_PROVIDER_SITE_OTHER): Payer: Medicaid Other | Admitting: Obstetrics and Gynecology

## 2019-03-26 VITALS — BP 142/90 | HR 101 | Wt 275.0 lb

## 2019-03-26 DIAGNOSIS — O10919 Unspecified pre-existing hypertension complicating pregnancy, unspecified trimester: Secondary | ICD-10-CM

## 2019-03-26 DIAGNOSIS — Z3A36 36 weeks gestation of pregnancy: Secondary | ICD-10-CM

## 2019-03-26 DIAGNOSIS — O10913 Unspecified pre-existing hypertension complicating pregnancy, third trimester: Secondary | ICD-10-CM

## 2019-03-26 DIAGNOSIS — Z3009 Encounter for other general counseling and advice on contraception: Secondary | ICD-10-CM

## 2019-03-26 DIAGNOSIS — O24313 Unspecified pre-existing diabetes mellitus in pregnancy, third trimester: Secondary | ICD-10-CM

## 2019-03-26 DIAGNOSIS — O0993 Supervision of high risk pregnancy, unspecified, third trimester: Secondary | ICD-10-CM

## 2019-03-26 DIAGNOSIS — O24312 Unspecified pre-existing diabetes mellitus in pregnancy, second trimester: Secondary | ICD-10-CM

## 2019-03-26 DIAGNOSIS — O09523 Supervision of elderly multigravida, third trimester: Secondary | ICD-10-CM

## 2019-03-26 DIAGNOSIS — O99820 Streptococcus B carrier state complicating pregnancy: Secondary | ICD-10-CM | POA: Insufficient documentation

## 2019-03-26 DIAGNOSIS — Z98891 History of uterine scar from previous surgery: Secondary | ICD-10-CM

## 2019-03-26 DIAGNOSIS — O0990 Supervision of high risk pregnancy, unspecified, unspecified trimester: Secondary | ICD-10-CM

## 2019-03-26 NOTE — Patient Instructions (Signed)
Third Trimester of Pregnancy The third trimester is from week 28 through week 40 (months 7 through 9). The third trimester is a time when the unborn baby (fetus) is growing rapidly. At the end of the ninth month, the fetus is about 20 inches in length and weighs 6-10 pounds. Body changes during your third trimester Your body will continue to go through many changes during pregnancy. The changes vary from woman to woman. During the third trimester:  Your weight will continue to increase. You can expect to gain 25-35 pounds (11-16 kg) by the end of the pregnancy.  You may begin to get stretch marks on your hips, abdomen, and breasts.  You may urinate more often because the fetus is moving lower into your pelvis and pressing on your bladder.  You may develop or continue to have heartburn. This is caused by increased hormones that slow down muscles in the digestive tract.  You may develop or continue to have constipation because increased hormones slow digestion and cause the muscles that push waste through your intestines to relax.  You may develop hemorrhoids. These are swollen veins (varicose veins) in the rectum that can itch or be painful.  You may develop swollen, bulging veins (varicose veins) in your legs.  You may have increased body aches in the pelvis, back, or thighs. This is due to weight gain and increased hormones that are relaxing your joints.  You may have changes in your hair. These can include thickening of your hair, rapid growth, and changes in texture. Some women also have hair loss during or after pregnancy, or hair that feels dry or thin. Your hair will most likely return to normal after your baby is born.  Your breasts will continue to grow and they will continue to become tender. A yellow fluid (colostrum) may leak from your breasts. This is the first milk you are producing for your baby.  Your belly button may stick out.  You may notice more swelling in your hands,  face, or ankles.  You may have increased tingling or numbness in your hands, arms, and legs. The skin on your belly may also feel numb.  You may feel short of breath because of your expanding uterus.  You may have more problems sleeping. This can be caused by the size of your belly, increased need to urinate, and an increase in your body's metabolism.  You may notice the fetus "dropping," or moving lower in your abdomen (lightening).  You may have increased vaginal discharge.  You may notice your joints feel loose and you may have pain around your pelvic bone. What to expect at prenatal visits You will have prenatal exams every 2 weeks until week 36. Then you will have weekly prenatal exams. During a routine prenatal visit:  You will be weighed to make sure you and the baby are growing normally.  Your blood pressure will be taken.  Your abdomen will be measured to track your baby's growth.  The fetal heartbeat will be listened to.  Any test results from the previous visit will be discussed.  You may have a cervical check near your due date to see if your cervix has softened or thinned (effaced).  You will be tested for Group B streptococcus. This happens between 35 and 37 weeks. Your health care provider may ask you:  What your birth plan is.  How you are feeling.  If you are feeling the baby move.  If you have had any abnormal   symptoms, such as leaking fluid, bleeding, severe headaches, or abdominal cramping.  If you are using any tobacco products, including cigarettes, chewing tobacco, and electronic cigarettes.  If you have any questions. Other tests or screenings that may be performed during your third trimester include:  Blood tests that check for low iron levels (anemia).  Fetal testing to check the health, activity level, and growth of the fetus. Testing is done if you have certain medical conditions or if there are problems during the pregnancy.  Nonstress test  (NST). This test checks the health of your baby to make sure there are no signs of problems, such as the baby not getting enough oxygen. During this test, a belt is placed around your belly. The baby is made to move, and its heart rate is monitored during movement. What is false labor? False labor is a condition in which you feel small, irregular tightenings of the muscles in the womb (contractions) that usually go away with rest, changing position, or drinking water. These are called Braxton Hicks contractions. Contractions may last for hours, days, or even weeks before true labor sets in. If contractions come at regular intervals, become more frequent, increase in intensity, or become painful, you should see your health care provider. What are the signs of labor?  Abdominal cramps.  Regular contractions that start at 10 minutes apart and become stronger and more frequent with time.  Contractions that start on the top of the uterus and spread down to the lower abdomen and back.  Increased pelvic pressure and dull back pain.  A watery or bloody mucus discharge that comes from the vagina.  Leaking of amniotic fluid. This is also known as your "water breaking." It could be a slow trickle or a gush. Let your health care provider know if it has a color or strange odor. If you have any of these signs, call your health care provider right away, even if it is before your due date. Follow these instructions at home: Medicines  Follow your health care provider's instructions regarding medicine use. Specific medicines may be either safe or unsafe to take during pregnancy.  Take a prenatal vitamin that contains at least 600 micrograms (mcg) of folic acid.  If you develop constipation, try taking a stool softener if your health care provider approves. Eating and drinking   Eat a balanced diet that includes fresh fruits and vegetables, whole grains, good sources of protein such as meat, eggs, or tofu,  and low-fat dairy. Your health care provider will help you determine the amount of weight gain that is right for you.  Avoid raw meat and uncooked cheese. These carry germs that can cause birth defects in the baby.  If you have low calcium intake from food, talk to your health care provider about whether you should take a daily calcium supplement.  Eat four or five small meals rather than three large meals a day.  Limit foods that are high in fat and processed sugars, such as fried and sweet foods.  To prevent constipation: ? Drink enough fluid to keep your urine clear or pale yellow. ? Eat foods that are high in fiber, such as fresh fruits and vegetables, whole grains, and beans. Activity  Exercise only as directed by your health care provider. Most women can continue their usual exercise routine during pregnancy. Try to exercise for 30 minutes at least 5 days a week. Stop exercising if you experience uterine contractions.  Avoid heavy lifting.  Do   not exercise in extreme heat or humidity, or at high altitudes.  Wear low-heel, comfortable shoes.  Practice good posture.  You may continue to have sex unless your health care provider tells you otherwise. Relieving pain and discomfort  Take frequent breaks and rest with your legs elevated if you have leg cramps or low back pain.  Take warm sitz baths to soothe any pain or discomfort caused by hemorrhoids. Use hemorrhoid cream if your health care provider approves.  Wear a good support bra to prevent discomfort from breast tenderness.  If you develop varicose veins: ? Wear support pantyhose or compression stockings as told by your healthcare provider. ? Elevate your feet for 15 minutes, 3-4 times a day. Prenatal care  Write down your questions. Take them to your prenatal visits.  Keep all your prenatal visits as told by your health care provider. This is important. Safety  Wear your seat belt at all times when driving.  Make  a list of emergency phone numbers, including numbers for family, friends, the hospital, and police and fire departments. General instructions  Avoid cat litter boxes and soil used by cats. These carry germs that can cause birth defects in the baby. If you have a cat, ask someone to clean the litter box for you.  Do not travel far distances unless it is absolutely necessary and only with the approval of your health care provider.  Do not use hot tubs, steam rooms, or saunas.  Do not drink alcohol.  Do not use any products that contain nicotine or tobacco, such as cigarettes and e-cigarettes. If you need help quitting, ask your health care provider.  Do not use any medicinal herbs or unprescribed drugs. These chemicals affect the formation and growth of the baby.  Do not douche or use tampons or scented sanitary pads.  Do not cross your legs for long periods of time.  To prepare for the arrival of your baby: ? Take prenatal classes to understand, practice, and ask questions about labor and delivery. ? Make a trial run to the hospital. ? Visit the hospital and tour the maternity area. ? Arrange for maternity or paternity leave through employers. ? Arrange for family and friends to take care of pets while you are in the hospital. ? Purchase a rear-facing car seat and make sure you know how to install it in your car. ? Pack your hospital bag. ? Prepare the baby's nursery. Make sure to remove all pillows and stuffed animals from the baby's crib to prevent suffocation.  Visit your dentist if you have not gone during your pregnancy. Use a soft toothbrush to brush your teeth and be gentle when you floss. Contact a health care provider if:  You are unsure if you are in labor or if your water has broken.  You become dizzy.  You have mild pelvic cramps, pelvic pressure, or nagging pain in your abdominal area.  You have lower back pain.  You have persistent nausea, vomiting, or diarrhea.   You have an unusual or bad smelling vaginal discharge.  You have pain when you urinate. Get help right away if:  Your water breaks before 37 weeks.  You have regular contractions less than 5 minutes apart before 37 weeks.  You have a fever.  You are leaking fluid from your vagina.  You have spotting or bleeding from your vagina.  You have severe abdominal pain or cramping.  You have rapid weight loss or weight gain.  You have   shortness of breath with chest pain.  You notice sudden or extreme swelling of your face, hands, ankles, feet, or legs.  Your baby makes fewer than 10 movements in 2 hours.  You have severe headaches that do not go away when you take medicine.  You have vision changes. Summary  The third trimester is from week 28 through week 40, months 7 through 9. The third trimester is a time when the unborn baby (fetus) is growing rapidly.  During the third trimester, your discomfort may increase as you and your baby continue to gain weight. You may have abdominal, leg, and back pain, sleeping problems, and an increased need to urinate.  During the third trimester your breasts will keep growing and they will continue to become tender. A yellow fluid (colostrum) may leak from your breasts. This is the first milk you are producing for your baby.  False labor is a condition in which you feel small, irregular tightenings of the muscles in the womb (contractions) that eventually go away. These are called Braxton Hicks contractions. Contractions may last for hours, days, or even weeks before true labor sets in.  Signs of labor can include: abdominal cramps; regular contractions that start at 10 minutes apart and become stronger and more frequent with time; watery or bloody mucus discharge that comes from the vagina; increased pelvic pressure and dull back pain; and leaking of amniotic fluid. This information is not intended to replace advice given to you by your health  care provider. Make sure you discuss any questions you have with your health care provider. Document Released: 04/12/2001 Document Revised: 08/09/2018 Document Reviewed: 05/24/2016 Elsevier Patient Education  2020 [employer]  

## 2019-03-26 NOTE — Progress Notes (Signed)
Subjective:  Miranda Barnett is a 43 y.o. G3P2002 at [redacted]w[redacted]d being seen today for ongoing prenatal care.  She is currently monitored for the following issues for this high-risk pregnancy and has Supervision of high risk pregnancy, antepartum; AMA (advanced maternal age) multigravida 35+; Obesity in pregnancy; Unsp pre-existing diabetes in pregnancy, second trimester; History of 2 cesarean sections; Chronic hypertension affecting pregnancy; Unwanted fertility; and GBS (group B Streptococcus carrier), +RV culture, currently pregnant on their problem list.  Patient reports general discomforts of pregnancy.  Contractions: Irregular. Vag. Bleeding: None.  Movement: Present. Denies leaking of fluid.   The following portions of the patient's history were reviewed and updated as appropriate: allergies, current medications, past family history, past medical history, past social history, past surgical history and problem list. Problem list updated.  Objective:   Vitals:   03/26/19 0932  BP: (!) 142/90  Pulse: (!) 101  Weight: 275 lb (124.7 kg)    Fetal Status: Fetal Heart Rate (bpm): 154   Movement: Present     General:  Alert, oriented and cooperative. Patient is in no acute distress.  Skin: Skin is warm and dry. No rash noted.   Cardiovascular: Normal heart rate noted  Respiratory: Normal respiratory effort, no problems with respiration noted  Abdomen: Soft, gravid, appropriate for gestational age. Pain/Pressure: Absent     Pelvic:  Cervical exam deferred        Extremities: Normal range of motion.  Edema: Trace  Mental Status: Normal mood and affect. Normal behavior. Normal judgment and thought content.   Urinalysis:      Assessment and Plan:  Pregnancy: G3P2002 at [redacted]w[redacted]d  1. Supervision of high risk pregnancy, antepartum Stable  2. Chronic hypertension affecting pregnancy BP stable Continue to monitor at home Continue with antenatal testing and qd BASA  3. Unsp pre-existing diabetes in  pregnancy, second trimester Did not bring CBG's Pt encouraged to bring CBG readings to each OB visit Fasting in goal range 2 hr PP @ 130 Will adjust insulin to  NPH 40/36   Humalog 38/34  4. History of 2 cesarean sections Repeat 04/11/19 with BTL  5. Multigravida of advanced maternal age in third trimester Stable  6. Unwanted fertility BTL papers signed  7. GBS (group B Streptococcus carrier), +RV culture, currently pregnant Tx while in labor  Term labor symptoms and general obstetric precautions including but not limited to vaginal bleeding, contractions, leaking of fluid and fetal movement were reviewed in detail with the patient. Please refer to After Visit Summary for other counseling recommendations.  Return in about 1 week (around 04/02/2019) for OB visit, face to face, MD faculty.   Chancy Milroy, MD

## 2019-03-26 NOTE — Progress Notes (Signed)
ROB.   C/o swollen ankles.  She is forgot the paper with her BS and BP recordings.

## 2019-03-27 ENCOUNTER — Telehealth (HOSPITAL_COMMUNITY): Payer: Self-pay | Admitting: *Deleted

## 2019-03-27 NOTE — Telephone Encounter (Signed)
Preadmission screen  

## 2019-04-01 ENCOUNTER — Ambulatory Visit (HOSPITAL_COMMUNITY): Payer: Medicaid Other | Admitting: *Deleted

## 2019-04-01 ENCOUNTER — Other Ambulatory Visit: Payer: Self-pay

## 2019-04-01 ENCOUNTER — Ambulatory Visit (HOSPITAL_COMMUNITY): Payer: Medicaid Other

## 2019-04-01 ENCOUNTER — Other Ambulatory Visit (HOSPITAL_COMMUNITY): Payer: Self-pay | Admitting: *Deleted

## 2019-04-01 ENCOUNTER — Encounter (HOSPITAL_COMMUNITY): Payer: Self-pay

## 2019-04-01 ENCOUNTER — Ambulatory Visit (HOSPITAL_COMMUNITY)
Admission: RE | Admit: 2019-04-01 | Discharge: 2019-04-01 | Disposition: A | Discharge status: Home / Self Care | Payer: Medicaid Other | Source: Ambulatory Visit | Attending: Obstetrics and Gynecology | Admitting: Obstetrics and Gynecology

## 2019-04-01 DIAGNOSIS — O10919 Unspecified pre-existing hypertension complicating pregnancy, unspecified trimester: Secondary | ICD-10-CM | POA: Diagnosis not present

## 2019-04-01 DIAGNOSIS — O24113 Pre-existing diabetes mellitus, type 2, in pregnancy, third trimester: Secondary | ICD-10-CM

## 2019-04-01 DIAGNOSIS — O99210 Obesity complicating pregnancy, unspecified trimester: Secondary | ICD-10-CM

## 2019-04-01 DIAGNOSIS — O10013 Pre-existing essential hypertension complicating pregnancy, third trimester: Secondary | ICD-10-CM

## 2019-04-01 DIAGNOSIS — O09523 Supervision of elderly multigravida, third trimester: Secondary | ICD-10-CM

## 2019-04-01 DIAGNOSIS — O34219 Maternal care for unspecified type scar from previous cesarean delivery: Secondary | ICD-10-CM

## 2019-04-01 DIAGNOSIS — O24312 Unspecified pre-existing diabetes mellitus in pregnancy, second trimester: Secondary | ICD-10-CM

## 2019-04-01 DIAGNOSIS — O99213 Obesity complicating pregnancy, third trimester: Secondary | ICD-10-CM | POA: Diagnosis not present

## 2019-04-01 DIAGNOSIS — Z362 Encounter for other antenatal screening follow-up: Secondary | ICD-10-CM

## 2019-04-01 DIAGNOSIS — Z3A37 37 weeks gestation of pregnancy: Secondary | ICD-10-CM | POA: Diagnosis not present

## 2019-04-03 ENCOUNTER — Other Ambulatory Visit: Payer: Self-pay

## 2019-04-03 ENCOUNTER — Ambulatory Visit (INDEPENDENT_AMBULATORY_CARE_PROVIDER_SITE_OTHER): Payer: Medicaid Other | Admitting: Obstetrics and Gynecology

## 2019-04-03 ENCOUNTER — Encounter: Payer: Self-pay | Admitting: Obstetrics and Gynecology

## 2019-04-03 VITALS — BP 134/84 | HR 97 | Wt 275.7 lb

## 2019-04-03 DIAGNOSIS — O10919 Unspecified pre-existing hypertension complicating pregnancy, unspecified trimester: Secondary | ICD-10-CM

## 2019-04-03 DIAGNOSIS — O0990 Supervision of high risk pregnancy, unspecified, unspecified trimester: Secondary | ICD-10-CM

## 2019-04-03 DIAGNOSIS — O99820 Streptococcus B carrier state complicating pregnancy: Secondary | ICD-10-CM

## 2019-04-03 DIAGNOSIS — Z3009 Encounter for other general counseling and advice on contraception: Secondary | ICD-10-CM

## 2019-04-03 DIAGNOSIS — O0993 Supervision of high risk pregnancy, unspecified, third trimester: Secondary | ICD-10-CM

## 2019-04-03 DIAGNOSIS — O09523 Supervision of elderly multigravida, third trimester: Secondary | ICD-10-CM

## 2019-04-03 DIAGNOSIS — O10913 Unspecified pre-existing hypertension complicating pregnancy, third trimester: Secondary | ICD-10-CM

## 2019-04-03 DIAGNOSIS — O24312 Unspecified pre-existing diabetes mellitus in pregnancy, second trimester: Secondary | ICD-10-CM

## 2019-04-03 DIAGNOSIS — Z98891 History of uterine scar from previous surgery: Secondary | ICD-10-CM

## 2019-04-03 DIAGNOSIS — Z3A37 37 weeks gestation of pregnancy: Secondary | ICD-10-CM

## 2019-04-03 DIAGNOSIS — O24313 Unspecified pre-existing diabetes mellitus in pregnancy, third trimester: Secondary | ICD-10-CM

## 2019-04-03 NOTE — Progress Notes (Signed)
Patient reports fetal movement with irregular contractions. Pt reports fasting BG today was 74.

## 2019-04-03 NOTE — Progress Notes (Signed)
   PRENATAL VISIT NOTE  Subjective:  Miranda Barnett is a 43 y.o. G3P2002 at [redacted]w[redacted]d being seen today for ongoing prenatal care.  She is currently monitored for the following issues for this high-risk pregnancy and has Supervision of high risk pregnancy, antepartum; AMA (advanced maternal age) multigravida 35+; Obesity in pregnancy; Unsp pre-existing diabetes in pregnancy, second trimester; History of 2 cesarean sections; Chronic hypertension affecting pregnancy; Unwanted fertility; and GBS (group B Streptococcus carrier), +RV culture, currently pregnant on their problem list.  Patient reports no complaints.  Contractions: Irregular. Vag. Bleeding: None.  Movement: Present. Denies leaking of fluid.   The following portions of the patient's history were reviewed and updated as appropriate: allergies, current medications, past family history, past medical history, past social history, past surgical history and problem list.   Objective:   Vitals:   04/03/19 0839  BP: 134/84  Pulse: 97  Weight: 275 lb 11.2 oz (125.1 kg)    Fetal Status: Fetal Heart Rate (bpm): 145   Movement: Present     General:  Alert, oriented and cooperative. Patient is in no acute distress.  Skin: Skin is warm and dry. No rash noted.   Cardiovascular: Normal heart rate noted  Respiratory: Normal respiratory effort, no problems with respiration noted  Abdomen: Soft, gravid, appropriate for gestational age.  Pain/Pressure: Present     Pelvic: Cervical exam deferred        Extremities: Normal range of motion.  Edema: Trace  Mental Status: Normal mood and affect. Normal behavior. Normal judgment and thought content.   Assessment and Plan:  Pregnancy: G3P2002 at [redacted]w[redacted]d 1. Supervision of high risk pregnancy, antepartum Patient is doing well without complaints  2. Chronic hypertension affecting pregnancy Stable without medication Continue ASA  3. Unsp pre-existing diabetes in pregnancy, second trimester Patient did not  bring CBG log or meter but reports fasting this am as 76 and pp range 120-125 with highest 128 Will continue on current NPH dosage Follow up ultrasound scheduled  4. History of 2 cesarean sections Patient scheduled for repeat and no longer desires BTL. Reviewed Progesterone only options  5. Multigravida of advanced maternal age in third trimester   6. Unwanted fertility   7. GBS (group B Streptococcus carrier), +RV culture, currently pregnant   Term labor symptoms and general obstetric precautions including but not limited to vaginal bleeding, contractions, leaking of fluid and fetal movement were reviewed in detail with the patient. Please refer to After Visit Summary for other counseling recommendations.   Return in about 1 week (around 04/10/2019) for Virtual, ROB, High risk.  Future Appointments  Date Time Provider [place]  04/08/2019  9:30 AM WH-MFC [place] 1 WH-MFCUS MFC-US  04/08/2019  9:40 AM WH-MFC NURSE WH-MFC MFC-US  04/09/2019  8:30 AM MC-MAU 1 MC-INDC None    Mora Bellman, MD

## 2019-04-04 ENCOUNTER — Encounter (HOSPITAL_COMMUNITY): Payer: Self-pay

## 2019-04-04 NOTE — Patient Instructions (Signed)
Miranda Barnett  04/04/2019   Your procedure is scheduled on:  04/11/2019  Arrive at 36 at Entrance C on [address] at [facility]  and [employer] [employer] [address]. You are invited to use the FREE valet parking or use the Visitor's parking deck.  Pick up the phone at the desk and dial [phone].  Call this number if you have problems the morning of surgery: [phone]  Remember:   Do not eat food:(After Midnight) Desps de medianoche.  Do not drink clear liquids: (After Midnight) Desps de medianoche.  Take these medicines the morning of surgery with A SIP OF WATER:  Take 19 units of novolin insulin the night before surgery.  No other medications the day of surgery   Do not wear jewelry, make-up or nail polish.  Do not wear lotions, powders, or perfumes. Do not wear deodorant.  Do not shave 48 hours prior to surgery.  Do not bring valuables to the hospital.  [facility] is not   responsible for any belongings or valuables brought to the hospital.  Contacts, dentures or bridgework may not be worn into surgery.  Leave suitcase in the car. After surgery it may be brought to your room.  For patients admitted to the hospital, checkout time is 11:00 AM the day of              discharge.      Please read over the following fact sheets that you were given:     Preparing for Surgery

## 2019-04-08 ENCOUNTER — Encounter (HOSPITAL_COMMUNITY): Payer: Self-pay

## 2019-04-08 ENCOUNTER — Ambulatory Visit (HOSPITAL_COMMUNITY): Payer: Medicaid Other | Admitting: *Deleted

## 2019-04-08 ENCOUNTER — Other Ambulatory Visit: Payer: Self-pay

## 2019-04-08 ENCOUNTER — Ambulatory Visit (HOSPITAL_COMMUNITY)
Admission: RE | Admit: 2019-04-08 | Discharge: 2019-04-08 | Disposition: A | Discharge status: Home / Self Care | Payer: Medicaid Other | Source: Ambulatory Visit | Attending: Obstetrics and Gynecology | Admitting: Obstetrics and Gynecology

## 2019-04-08 DIAGNOSIS — O99210 Obesity complicating pregnancy, unspecified trimester: Secondary | ICD-10-CM

## 2019-04-08 DIAGNOSIS — O24312 Unspecified pre-existing diabetes mellitus in pregnancy, second trimester: Secondary | ICD-10-CM | POA: Diagnosis not present

## 2019-04-08 DIAGNOSIS — O34219 Maternal care for unspecified type scar from previous cesarean delivery: Secondary | ICD-10-CM | POA: Diagnosis not present

## 2019-04-08 DIAGNOSIS — Z3A38 38 weeks gestation of pregnancy: Secondary | ICD-10-CM | POA: Diagnosis not present

## 2019-04-08 DIAGNOSIS — O99213 Obesity complicating pregnancy, third trimester: Secondary | ICD-10-CM

## 2019-04-08 DIAGNOSIS — O10013 Pre-existing essential hypertension complicating pregnancy, third trimester: Secondary | ICD-10-CM

## 2019-04-08 DIAGNOSIS — O24113 Pre-existing diabetes mellitus, type 2, in pregnancy, third trimester: Secondary | ICD-10-CM

## 2019-04-08 DIAGNOSIS — O09523 Supervision of elderly multigravida, third trimester: Secondary | ICD-10-CM | POA: Diagnosis not present

## 2019-04-09 ENCOUNTER — Other Ambulatory Visit (HOSPITAL_COMMUNITY)
Admission: RE | Admit: 2019-04-09 | Discharge: 2019-04-09 | Disposition: A | Discharge status: Home / Self Care | Payer: Medicaid Other | Source: Ambulatory Visit | Attending: Obstetrics and Gynecology | Admitting: Obstetrics and Gynecology

## 2019-04-09 DIAGNOSIS — Z01812 Encounter for preprocedural laboratory examination: Secondary | ICD-10-CM | POA: Insufficient documentation

## 2019-04-09 DIAGNOSIS — Z20828 Contact with and (suspected) exposure to other viral communicable diseases: Secondary | ICD-10-CM | POA: Insufficient documentation

## 2019-04-09 HISTORY — DX: Essential (primary) hypertension: I10

## 2019-04-09 LAB — COMPREHENSIVE METABOLIC PANEL
ALT: 16 U/L (ref 0–44)
AST: 17 U/L (ref 15–41)
Albumin: 2.9 g/dL — ABNORMAL LOW (ref 3.5–5.0)
Alkaline Phosphatase: 70 U/L (ref 38–126)
Anion gap: 10 (ref 5–15)
BUN: 6 mg/dL (ref 6–20)
CO2: 21 mmol/L — ABNORMAL LOW (ref 22–32)
Calcium: 9.1 mg/dL (ref 8.9–10.3)
Chloride: 104 mmol/L (ref 98–111)
Creatinine, Ser: 0.57 mg/dL (ref 0.44–1.00)
GFR calc Af Amer: >60 mL/min (ref >60)
GFR calc non Af Amer: >60 mL/min (ref >60)
Glucose, Bld: 88 mg/dL (ref 70–99)
Potassium: 3.8 mmol/L (ref 3.5–5.1)
Sodium: 135 mmol/L (ref 135–145)
Total Bilirubin: 0.5 mg/dL (ref 0.3–1.2)
Total Protein: 6.6 g/dL (ref 6.5–8.1)

## 2019-04-09 LAB — ABO/RH: ABO/RH(D): A POS

## 2019-04-09 LAB — TYPE AND SCREEN
ABO/RH(D): A POS
Antibody Screen: NEGATIVE

## 2019-04-09 LAB — CBC
HCT: 36.2 % (ref 36.0–46.0)
Hemoglobin: 12.1 g/dL (ref 12.0–15.0)
MCH: 31.1 pg (ref 26.0–34.0)
MCHC: 33.4 g/dL (ref 30.0–36.0)
MCV: 93.1 fL (ref 80.0–100.0)
Platelets: 322 10*3/uL (ref 150–400)
RBC: 3.89 MIL/uL (ref 3.87–5.11)
RDW: 14.6 % (ref 11.5–15.5)
WBC: 8.8 10*3/uL (ref 4.0–10.5)
nRBC: 0 % (ref 0.0–0.2)

## 2019-04-09 LAB — SARS CORONAVIRUS 2 (TAT 6-24 HRS): SARS Coronavirus 2: NEGATIVE

## 2019-04-09 NOTE — MAU Note (Signed)
Covid swab collected.PT tolerated well. Asymptomatic. CBC/RPR/ Type and screen drawn by lab

## 2019-04-10 ENCOUNTER — Telehealth (INDEPENDENT_AMBULATORY_CARE_PROVIDER_SITE_OTHER): Payer: Medicaid Other | Admitting: Obstetrics and Gynecology

## 2019-04-10 ENCOUNTER — Encounter: Payer: Self-pay | Admitting: Obstetrics and Gynecology

## 2019-04-10 ENCOUNTER — Other Ambulatory Visit: Payer: Self-pay

## 2019-04-10 DIAGNOSIS — O24313 Unspecified pre-existing diabetes mellitus in pregnancy, third trimester: Secondary | ICD-10-CM | POA: Diagnosis not present

## 2019-04-10 DIAGNOSIS — Z3A38 38 weeks gestation of pregnancy: Secondary | ICD-10-CM | POA: Diagnosis not present

## 2019-04-10 DIAGNOSIS — O99820 Streptococcus B carrier state complicating pregnancy: Secondary | ICD-10-CM | POA: Diagnosis not present

## 2019-04-10 DIAGNOSIS — O09523 Supervision of elderly multigravida, third trimester: Secondary | ICD-10-CM | POA: Diagnosis not present

## 2019-04-10 DIAGNOSIS — O10919 Unspecified pre-existing hypertension complicating pregnancy, unspecified trimester: Secondary | ICD-10-CM

## 2019-04-10 DIAGNOSIS — O0993 Supervision of high risk pregnancy, unspecified, third trimester: Secondary | ICD-10-CM

## 2019-04-10 DIAGNOSIS — O10913 Unspecified pre-existing hypertension complicating pregnancy, third trimester: Secondary | ICD-10-CM | POA: Diagnosis not present

## 2019-04-10 DIAGNOSIS — Z98891 History of uterine scar from previous surgery: Secondary | ICD-10-CM

## 2019-04-10 DIAGNOSIS — O24312 Unspecified pre-existing diabetes mellitus in pregnancy, second trimester: Secondary | ICD-10-CM

## 2019-04-10 DIAGNOSIS — Z3009 Encounter for other general counseling and advice on contraception: Secondary | ICD-10-CM

## 2019-04-10 DIAGNOSIS — O0990 Supervision of high risk pregnancy, unspecified, unspecified trimester: Secondary | ICD-10-CM

## 2019-04-10 LAB — RPR: RPR Ser Ql: NONREACTIVE

## 2019-04-10 NOTE — Progress Notes (Signed)
I connected with  Miranda Barnett on 04/10/19 by a video enabled telemedicine application and verified that I am speaking with the correct person using two identifiers.   I discussed the limitations of evaluation and management by telemedicine. The patient expressed understanding and agreed to proceed.  [facility].  She is not using [employer]. Her fasting BS 82 after breakfast 115.  She is not at home and has not checked her BP.

## 2019-04-10 NOTE — Progress Notes (Signed)
   TELEHEALTH VIRTUAL OBSTETRICS VISIT ENCOUNTER NOTE  I connected with Miranda Barnett on 04/10/19 at  1:00 PM EST by telephone at home and verified that I am speaking with the correct person using two identifiers.   I discussed the limitations, risks, security and privacy concerns of performing an evaluation and management service by telephone and the availability of in person appointments. I also discussed with the patient that there may be a patient responsible charge related to this service. The patient expressed understanding and agreed to proceed.  Subjective:  Miranda Barnett is a 43 y.o. G3P2002 at [redacted]w[redacted]d being followed for ongoing prenatal care.  She is currently monitored for the following issues for this high-risk pregnancy and has Supervision of high risk pregnancy, antepartum; AMA (advanced maternal age) multigravida 35+; Obesity in pregnancy; Unsp pre-existing diabetes in pregnancy, second trimester; History of 2 cesarean sections; Chronic hypertension affecting pregnancy; Unwanted fertility; and GBS (group B Streptococcus carrier), +RV culture, currently pregnant on their problem list.  Patient reports general discomforts of pregnancy. Reports fetal movement. Denies any contractions, bleeding or leaking of fluid.   The following portions of the patient's history were reviewed and updated as appropriate: allergies, current medications, past family history, past medical history, past social history, past surgical history and problem list.   Objective:   General:  Alert, oriented and cooperative.   Mental Status: Normal mood and affect perceived. Normal judgment and thought content.  Rest of physical exam deferred due to type of encounter  Assessment and Plan:  Pregnancy: G3P2002 at [redacted]w[redacted]d 1. Supervision of high risk pregnancy, antepartum Stable  2. Chronic hypertension affecting pregnancy BP stable  3. Multigravida of advanced maternal age in third trimester Stable  4. GBS  (group B Streptococcus carrier), +RV culture, currently pregnant Tx with c section  5. History of 2 cesarean sections For repeat with BTL tomorrow  6. Unwanted fertility BTL papers signed BTL tomorrow with c section  7. Unsp pre-existing diabetes in pregnancy, second trimester CBG's controlled with current regiment Pt instructed to take only half of long acting insulin in AM, in preparation of surgery   Term labor symptoms and general obstetric precautions including but not limited to vaginal bleeding, contractions, leaking of fluid and fetal movement were reviewed in detail with the patient.  I discussed the assessment and treatment plan with the patient. The patient was provided an opportunity to ask questions and all were answered. The patient agreed with the plan and demonstrated an understanding of the instructions. The patient was advised to call back or seek an in-person office evaluation/go to MAU at [facility] for any urgent or concerning symptoms. Please refer to After Visit Summary for other counseling recommendations.   I provided 8 minutes of non-face-to-face time during this encounter.  F/U appt for BP and incision check will be scheduled at time of discharge from hospital.  No follow-ups on file.  No future appointments.  Chancy Milroy, MD [facility] [place] [place]

## 2019-04-11 ENCOUNTER — Encounter (HOSPITAL_COMMUNITY)
Admission: RE | Disposition: A | Discharge status: Home / Self Care | Payer: Self-pay | Source: Home / Self Care | Attending: Obstetrics & Gynecology

## 2019-04-11 ENCOUNTER — Inpatient Hospital Stay (HOSPITAL_COMMUNITY)
Admission: RE | Admit: 2019-04-11 | Discharge: 2019-04-13 | DRG: 783 | Disposition: A | Discharge status: Home / Self Care | Payer: Medicaid Other | Attending: Obstetrics & Gynecology | Admitting: Obstetrics & Gynecology

## 2019-04-11 ENCOUNTER — Other Ambulatory Visit: Payer: Self-pay

## 2019-04-11 ENCOUNTER — Encounter (HOSPITAL_COMMUNITY): Payer: Self-pay | Admitting: Obstetrics & Gynecology

## 2019-04-11 ENCOUNTER — Inpatient Hospital Stay (HOSPITAL_COMMUNITY): Payer: Medicaid Other | Admitting: Anesthesiology

## 2019-04-11 DIAGNOSIS — Z302 Encounter for sterilization: Secondary | ICD-10-CM

## 2019-04-11 DIAGNOSIS — O24319 Unspecified pre-existing diabetes mellitus in pregnancy, unspecified trimester: Secondary | ICD-10-CM | POA: Diagnosis present

## 2019-04-11 DIAGNOSIS — O2412 Pre-existing diabetes mellitus, type 2, in childbirth: Secondary | ICD-10-CM | POA: Diagnosis present

## 2019-04-11 DIAGNOSIS — E119 Type 2 diabetes mellitus without complications: Secondary | ICD-10-CM | POA: Diagnosis not present

## 2019-04-11 DIAGNOSIS — Z794 Long term (current) use of insulin: Secondary | ICD-10-CM | POA: Diagnosis not present

## 2019-04-11 DIAGNOSIS — O1002 Pre-existing essential hypertension complicating childbirth: Secondary | ICD-10-CM | POA: Diagnosis present

## 2019-04-11 DIAGNOSIS — Z87891 Personal history of nicotine dependence: Secondary | ICD-10-CM | POA: Diagnosis not present

## 2019-04-11 DIAGNOSIS — O99820 Streptococcus B carrier state complicating pregnancy: Secondary | ICD-10-CM

## 2019-04-11 DIAGNOSIS — O99210 Obesity complicating pregnancy, unspecified trimester: Secondary | ICD-10-CM | POA: Diagnosis present

## 2019-04-11 DIAGNOSIS — O99214 Obesity complicating childbirth: Secondary | ICD-10-CM | POA: Diagnosis present

## 2019-04-11 DIAGNOSIS — O10919 Unspecified pre-existing hypertension complicating pregnancy, unspecified trimester: Secondary | ICD-10-CM | POA: Diagnosis present

## 2019-04-11 DIAGNOSIS — O34211 Maternal care for low transverse scar from previous cesarean delivery: Secondary | ICD-10-CM | POA: Diagnosis not present

## 2019-04-11 DIAGNOSIS — O0990 Supervision of high risk pregnancy, unspecified, unspecified trimester: Secondary | ICD-10-CM

## 2019-04-11 DIAGNOSIS — I10 Essential (primary) hypertension: Secondary | ICD-10-CM | POA: Diagnosis present

## 2019-04-11 DIAGNOSIS — Z3A39 39 weeks gestation of pregnancy: Secondary | ICD-10-CM

## 2019-04-11 DIAGNOSIS — Z3009 Encounter for other general counseling and advice on contraception: Secondary | ICD-10-CM | POA: Diagnosis present

## 2019-04-11 DIAGNOSIS — O24312 Unspecified pre-existing diabetes mellitus in pregnancy, second trimester: Secondary | ICD-10-CM

## 2019-04-11 DIAGNOSIS — Z98891 History of uterine scar from previous surgery: Secondary | ICD-10-CM

## 2019-04-11 DIAGNOSIS — O09529 Supervision of elderly multigravida, unspecified trimester: Secondary | ICD-10-CM

## 2019-04-11 DIAGNOSIS — O99824 Streptococcus B carrier state complicating childbirth: Secondary | ICD-10-CM | POA: Diagnosis present

## 2019-04-11 LAB — COMPREHENSIVE METABOLIC PANEL
ALT: 15 U/L (ref 0–44)
AST: 19 U/L (ref 15–41)
Albumin: 2.4 g/dL — ABNORMAL LOW (ref 3.5–5.0)
Alkaline Phosphatase: 60 U/L (ref 38–126)
Anion gap: 8 (ref 5–15)
BUN: 5 mg/dL — ABNORMAL LOW (ref 6–20)
CO2: 24 mmol/L (ref 22–32)
Calcium: 8.6 mg/dL — ABNORMAL LOW (ref 8.9–10.3)
Chloride: 105 mmol/L (ref 98–111)
Creatinine, Ser: 0.63 mg/dL (ref 0.44–1.00)
GFR calc Af Amer: >60 mL/min (ref >60)
GFR calc non Af Amer: >60 mL/min (ref >60)
Glucose, Bld: 85 mg/dL (ref 70–99)
Potassium: 3.9 mmol/L (ref 3.5–5.1)
Sodium: 137 mmol/L (ref 135–145)
Total Bilirubin: 0.3 mg/dL (ref 0.3–1.2)
Total Protein: 5.6 g/dL — ABNORMAL LOW (ref 6.5–8.1)

## 2019-04-11 LAB — GLUCOSE, CAPILLARY
Glucose-Capillary: 93 mg/dL (ref 70–99)
Glucose-Capillary: 86 mg/dL (ref 70–99)
Glucose-Capillary: 73 mg/dL (ref 70–99)
Glucose-Capillary: 98 mg/dL (ref 70–99)
Glucose-Capillary: 70 mg/dL (ref 70–99)

## 2019-04-11 LAB — HEMOGLOBIN A1C
Hgb A1c MFr Bld: 5.8 % — ABNORMAL HIGH (ref 4.8–5.6)
Mean Plasma Glucose: 119.76 mg/dL

## 2019-04-11 LAB — CBC
HCT: 35.1 % — ABNORMAL LOW (ref 36.0–46.0)
Hemoglobin: 11.5 g/dL — ABNORMAL LOW (ref 12.0–15.0)
MCH: 30.8 pg (ref 26.0–34.0)
MCHC: 32.8 g/dL (ref 30.0–36.0)
MCV: 94.1 fL (ref 80.0–100.0)
Platelets: 289 10*3/uL (ref 150–400)
RBC: 3.73 MIL/uL — ABNORMAL LOW (ref 3.87–5.11)
RDW: 14.7 % (ref 11.5–15.5)
WBC: 8.2 10*3/uL (ref 4.0–10.5)
nRBC: 0 % (ref 0.0–0.2)

## 2019-04-11 LAB — RAPID URINE DRUG SCREEN, HOSP PERFORMED
Amphetamines: NOT DETECTED
Amphetamines: NOT DETECTED
Barbiturates: NOT DETECTED
Barbiturates: NOT DETECTED
Benzodiazepines: NOT DETECTED
Benzodiazepines: NOT DETECTED
Cocaine: NOT DETECTED
Cocaine: NOT DETECTED
Opiates: NOT DETECTED
Opiates: NOT DETECTED
Tetrahydrocannabinol: NOT DETECTED
Tetrahydrocannabinol: NOT DETECTED

## 2019-04-11 LAB — PROTEIN / CREATININE RATIO, URINE
Creatinine, Urine: 132.11 mg/dL
Protein Creatinine Ratio: 0.2 mg/mg{Cre} — ABNORMAL HIGH (ref 0.00–0.15)
Total Protein, Urine: 26 mg/dL

## 2019-04-11 SURGERY — Surgical Case
Anesthesia: Spinal | Site: Abdomen | Laterality: Bilateral | Wound class: Clean Contaminated

## 2019-04-11 MED ORDER — SOD CITRATE-CITRIC ACID 500-334 MG/5ML PO SOLN
ORAL | Status: AC
  Filled 2019-04-11: qty 30

## 2019-04-11 MED ORDER — DIPHENHYDRAMINE HCL 50 MG/ML IJ SOLN: 12.5000 mg | INTRAMUSCULAR | Status: DC | PRN

## 2019-04-11 MED ORDER — ONDANSETRON HCL 4 MG/2ML IJ SOLN
INTRAMUSCULAR | Status: AC
  Filled 2019-04-11: qty 2

## 2019-04-11 MED ORDER — DIPHENHYDRAMINE HCL 25 MG PO CAPS
25.0000 mg | ORAL_CAPSULE | Freq: Four times a day (QID) | ORAL | Status: DC | PRN
  Administered 2019-04-11: 25 mg via ORAL

## 2019-04-11 MED ORDER — KETOROLAC TROMETHAMINE 30 MG/ML IJ SOLN
30.0000 mg | Freq: Four times a day (QID) | INTRAMUSCULAR | Status: AC | PRN
  Administered 2019-04-11: 30 mg via INTRAMUSCULAR

## 2019-04-11 MED ORDER — HYDROMORPHONE HCL 1 MG/ML IJ SOLN: 0.2500 mg | INTRAMUSCULAR | Status: DC | PRN

## 2019-04-11 MED ORDER — FENTANYL CITRATE (PF) 100 MCG/2ML IJ SOLN
INTRAMUSCULAR | Status: AC
  Filled 2019-04-11: qty 2

## 2019-04-11 MED ORDER — PHENYLEPHRINE HCL-NACL 20-0.9 MG/250ML-% IV SOLN
INTRAVENOUS | Status: DC | PRN
  Administered 2019-04-11: 60 ug/min via INTRAVENOUS

## 2019-04-11 MED ORDER — SCOPOLAMINE 1 MG/3DAYS TD PT72
MEDICATED_PATCH | TRANSDERMAL | Status: AC
  Filled 2019-04-11: qty 1

## 2019-04-11 MED ORDER — SCOPOLAMINE 1 MG/3DAYS TD PT72
1.0000 | MEDICATED_PATCH | Freq: Once | TRANSDERMAL | Status: DC
  Administered 2019-04-11: 1.5 mg via TRANSDERMAL

## 2019-04-11 MED ORDER — ACETAMINOPHEN 500 MG PO TABS
1000.0000 mg | ORAL_TABLET | Freq: Four times a day (QID) | ORAL | Status: AC
  Administered 2019-04-11 (×2): 1000 mg via ORAL
  Filled 2019-04-11 (×3): qty 2

## 2019-04-11 MED ORDER — SODIUM CHLORIDE 0.9 % IV SOLN
2.0000 g | INTRAVENOUS | Status: AC
  Administered 2019-04-11: 09:00:00 2 g via INTRAVENOUS

## 2019-04-11 MED ORDER — SODIUM CHLORIDE 0.9% FLUSH: 3.0000 mL | INTRAVENOUS | Status: DC | PRN

## 2019-04-11 MED ORDER — KETOROLAC TROMETHAMINE 30 MG/ML IJ SOLN: 30.0000 mg | Freq: Once | INTRAMUSCULAR | Status: DC | PRN

## 2019-04-11 MED ORDER — MORPHINE SULFATE (PF) 0.5 MG/ML IJ SOLN
INTRAMUSCULAR | Status: AC
  Filled 2019-04-11: qty 10

## 2019-04-11 MED ORDER — ZOLPIDEM TARTRATE 5 MG PO TABS: 5.0000 mg | ORAL_TABLET | Freq: Every evening | ORAL | Status: DC | PRN

## 2019-04-11 MED ORDER — FENTANYL CITRATE (PF) 100 MCG/2ML IJ SOLN
INTRAMUSCULAR | Status: DC | PRN
  Administered 2019-04-11: 15 ug via INTRATHECAL

## 2019-04-11 MED ORDER — SOD CITRATE-CITRIC ACID 500-334 MG/5ML PO SOLN
30.0000 mL | ORAL | Status: AC
  Administered 2019-04-11: 30 mL via ORAL

## 2019-04-11 MED ORDER — ACETAMINOPHEN 325 MG PO TABS
650.0000 mg | ORAL_TABLET | Freq: Four times a day (QID) | ORAL | Status: DC | PRN
  Administered 2019-04-12 – 2019-04-13 (×3): 650 mg via ORAL
  Filled 2019-04-11 (×4): qty 2

## 2019-04-11 MED ORDER — ENALAPRIL MALEATE 5 MG PO TABS
5.0000 mg | ORAL_TABLET | Freq: Every day | ORAL | Status: DC
  Administered 2019-04-12: 5 mg via ORAL
  Filled 2019-04-11 (×2): qty 1

## 2019-04-11 MED ORDER — ENOXAPARIN SODIUM 60 MG/0.6ML ~~LOC~~ SOLN
0.5000 mg/kg | SUBCUTANEOUS | Status: DC
  Administered 2019-04-12 – 2019-04-13 (×2): 60 mg via SUBCUTANEOUS
  Filled 2019-04-11 (×2): qty 0.6

## 2019-04-11 MED ORDER — KETOROLAC TROMETHAMINE 30 MG/ML IJ SOLN
INTRAMUSCULAR | Status: AC
  Filled 2019-04-11: qty 1

## 2019-04-11 MED ORDER — OXYCODONE HCL 5 MG PO TABS: 5.0000 mg | ORAL_TABLET | ORAL | Status: DC | PRN

## 2019-04-11 MED ORDER — BUPIVACAINE IN DEXTROSE 0.75-8.25 % IT SOLN
INTRATHECAL | Status: DC | PRN
  Administered 2019-04-11: 1.8 mL via INTRATHECAL

## 2019-04-11 MED ORDER — SENNOSIDES-DOCUSATE SODIUM 8.6-50 MG PO TABS
2.0000 | ORAL_TABLET | ORAL | Status: DC
  Administered 2019-04-11 – 2019-04-12 (×2): 2 via ORAL
  Filled 2019-04-11 (×2): qty 2

## 2019-04-11 MED ORDER — ACETAMINOPHEN 500 MG PO TABS
1000.0000 mg | ORAL_TABLET | ORAL | Status: AC
  Administered 2019-04-11: 1000 mg via ORAL

## 2019-04-11 MED ORDER — DEXAMETHASONE SODIUM PHOSPHATE 4 MG/ML IJ SOLN
INTRAMUSCULAR | Status: DC | PRN
  Administered 2019-04-11: 4 mg via INTRAVENOUS

## 2019-04-11 MED ORDER — SIMETHICONE 80 MG PO CHEW: 80.0000 mg | CHEWABLE_TABLET | ORAL | Status: DC | PRN

## 2019-04-11 MED ORDER — INSULIN NPH (HUMAN) (ISOPHANE) 100 UNIT/ML ~~LOC~~ SUSP
15.0000 [IU] | Freq: Two times a day (BID) | SUBCUTANEOUS | Status: DC
  Administered 2019-04-11 – 2019-04-12 (×2): 15 [IU] via SUBCUTANEOUS
  Filled 2019-04-11: qty 10

## 2019-04-11 MED ORDER — SIMETHICONE 80 MG PO CHEW
80.0000 mg | CHEWABLE_TABLET | Freq: Three times a day (TID) | ORAL | Status: DC
  Administered 2019-04-11 – 2019-04-13 (×6): 80 mg via ORAL
  Filled 2019-04-11 (×7): qty 1

## 2019-04-11 MED ORDER — SODIUM CHLORIDE 0.9 % IR SOLN
Status: DC | PRN
  Administered 2019-04-11: 1

## 2019-04-11 MED ORDER — PHENYLEPHRINE HCL-NACL 20-0.9 MG/250ML-% IV SOLN
INTRAVENOUS | Status: AC
  Filled 2019-04-11: qty 250

## 2019-04-11 MED ORDER — NALOXONE HCL 4 MG/10ML IJ SOLN
1.0000 ug/kg/h | INTRAVENOUS | Status: DC | PRN
  Filled 2019-04-11: qty 5

## 2019-04-11 MED ORDER — DIPHENHYDRAMINE HCL 25 MG PO CAPS
25.0000 mg | ORAL_CAPSULE | ORAL | Status: DC | PRN
  Administered 2019-04-12: 25 mg via ORAL
  Filled 2019-04-11 (×2): qty 1

## 2019-04-11 MED ORDER — SODIUM CHLORIDE 0.9 % IV SOLN
INTRAVENOUS | Status: DC | PRN
  Administered 2019-04-11: 10:00:00 via INTRAVENOUS

## 2019-04-11 MED ORDER — METFORMIN HCL 500 MG PO TABS: 1000.0000 mg | ORAL_TABLET | Freq: Two times a day (BID) | ORAL | Status: DC

## 2019-04-11 MED ORDER — STERILE WATER FOR IRRIGATION IR SOLN
Status: DC | PRN
  Administered 2019-04-11: 1

## 2019-04-11 MED ORDER — OXYTOCIN 40 UNITS IN NORMAL SALINE INFUSION - SIMPLE MED
INTRAVENOUS | Status: DC | PRN
  Administered 2019-04-11: 40 mL via INTRAVENOUS

## 2019-04-11 MED ORDER — SODIUM CHLORIDE 0.9 % IV SOLN
INTRAVENOUS | Status: AC
  Filled 2019-04-11: qty 2

## 2019-04-11 MED ORDER — SIMETHICONE 80 MG PO CHEW
80.0000 mg | CHEWABLE_TABLET | ORAL | Status: DC
  Administered 2019-04-12: 80 mg via ORAL
  Filled 2019-04-11 (×2): qty 1

## 2019-04-11 MED ORDER — GABAPENTIN 300 MG PO CAPS
300.0000 mg | ORAL_CAPSULE | ORAL | Status: AC
  Administered 2019-04-11: 300 mg via ORAL

## 2019-04-11 MED ORDER — KETOROLAC TROMETHAMINE 30 MG/ML IJ SOLN: 30.0000 mg | Freq: Four times a day (QID) | INTRAMUSCULAR | Status: AC | PRN

## 2019-04-11 MED ORDER — TETANUS-DIPHTH-ACELL PERTUSSIS 5-2.5-18.5 LF-MCG/0.5 IM SUSP: 0.5000 mL | Freq: Once | INTRAMUSCULAR | Status: DC

## 2019-04-11 MED ORDER — NALOXONE HCL 0.4 MG/ML IJ SOLN: 0.4000 mg | INTRAMUSCULAR | Status: DC | PRN

## 2019-04-11 MED ORDER — OXYCODONE HCL 5 MG PO TABS: 5.0000 mg | ORAL_TABLET | Freq: Once | ORAL | Status: DC | PRN

## 2019-04-11 MED ORDER — NALBUPHINE HCL 10 MG/ML IJ SOLN: 5.0000 mg | INTRAMUSCULAR | Status: DC | PRN

## 2019-04-11 MED ORDER — ONDANSETRON HCL 4 MG/2ML IJ SOLN: 4.0000 mg | Freq: Three times a day (TID) | INTRAMUSCULAR | Status: DC | PRN

## 2019-04-11 MED ORDER — MEPERIDINE HCL 25 MG/ML IJ SOLN: 6.2500 mg | INTRAMUSCULAR | Status: DC | PRN

## 2019-04-11 MED ORDER — OXYTOCIN 40 UNITS IN NORMAL SALINE INFUSION - SIMPLE MED: 2.5000 [IU]/h | INTRAVENOUS | Status: AC

## 2019-04-11 MED ORDER — INSULIN ASPART 100 UNIT/ML ~~LOC~~ SOLN: 0.0000 [IU] | Freq: Three times a day (TID) | SUBCUTANEOUS | Status: DC

## 2019-04-11 MED ORDER — GABAPENTIN 300 MG PO CAPS
ORAL_CAPSULE | ORAL | Status: AC
  Filled 2019-04-11: qty 1

## 2019-04-11 MED ORDER — ONDANSETRON HCL 4 MG/2ML IJ SOLN
INTRAMUSCULAR | Status: DC | PRN
  Administered 2019-04-11: 4 mg via INTRAVENOUS

## 2019-04-11 MED ORDER — IBUPROFEN 800 MG PO TABS
800.0000 mg | ORAL_TABLET | Freq: Three times a day (TID) | ORAL | Status: DC
  Administered 2019-04-11 – 2019-04-13 (×6): 800 mg via ORAL
  Filled 2019-04-11 (×6): qty 1

## 2019-04-11 MED ORDER — MENTHOL 3 MG MT LOZG: 1.0000 | LOZENGE | OROMUCOSAL | Status: DC | PRN

## 2019-04-11 MED ORDER — PROMETHAZINE HCL 25 MG/ML IJ SOLN: 6.2500 mg | INTRAMUSCULAR | Status: DC | PRN

## 2019-04-11 MED ORDER — LACTATED RINGERS IV SOLN
INTRAVENOUS | Status: DC
  Administered 2019-04-11: 125 mL/h via INTRAVENOUS
  Administered 2019-04-11: 15:00:00 via INTRAVENOUS

## 2019-04-11 MED ORDER — MORPHINE SULFATE (PF) 0.5 MG/ML IJ SOLN
INTRAMUSCULAR | Status: DC | PRN
  Administered 2019-04-11: .15 mg via INTRATHECAL

## 2019-04-11 MED ORDER — ACETAMINOPHEN 500 MG PO TABS
ORAL_TABLET | ORAL | Status: AC
  Filled 2019-04-11: qty 2

## 2019-04-11 MED ORDER — LACTATED RINGERS IV SOLN
INTRAVENOUS | Status: DC
  Administered 2019-04-11 (×2): via INTRAVENOUS

## 2019-04-11 MED ORDER — WITCH HAZEL-GLYCERIN EX PADS: 1.0000 "application " | MEDICATED_PAD | CUTANEOUS | Status: DC | PRN

## 2019-04-11 MED ORDER — DIBUCAINE (PERIANAL) 1 % EX OINT: 1.0000 "application " | TOPICAL_OINTMENT | CUTANEOUS | Status: DC | PRN

## 2019-04-11 MED ORDER — INSULIN ASPART 100 UNIT/ML ~~LOC~~ SOLN
15.0000 [IU] | Freq: Two times a day (BID) | SUBCUTANEOUS | Status: DC
  Administered 2019-04-11: 15 [IU] via SUBCUTANEOUS

## 2019-04-11 MED ORDER — NALBUPHINE HCL 10 MG/ML IJ SOLN: 5.0000 mg | Freq: Once | INTRAMUSCULAR | Status: DC | PRN

## 2019-04-11 MED ORDER — OXYTOCIN 40 UNITS IN NORMAL SALINE INFUSION - SIMPLE MED
INTRAVENOUS | Status: AC
  Filled 2019-04-11: qty 1000

## 2019-04-11 MED ORDER — OXYCODONE HCL 5 MG/5ML PO SOLN: 5.0000 mg | Freq: Once | ORAL | Status: DC | PRN

## 2019-04-11 MED ORDER — PRENATAL MULTIVITAMIN CH
1.0000 | ORAL_TABLET | Freq: Every day | ORAL | Status: DC
  Administered 2019-04-12: 1 via ORAL
  Filled 2019-04-11: qty 1

## 2019-04-11 MED ORDER — COCONUT OIL OIL: 1.0000 "application " | TOPICAL_OIL | Status: DC | PRN

## 2019-04-11 SURGICAL SUPPLY — 39 items
BENZOIN TINCTURE PRP APPL 2/3 (GAUZE/BANDAGES/DRESSINGS) ×3 IMPLANT
CHLORAPREP W/TINT 26ML (MISCELLANEOUS) ×3 IMPLANT
CLAMP CORD UMBIL (MISCELLANEOUS) IMPLANT
CLOSURE STERI STRIP 1/2 X4 (GAUZE/BANDAGES/DRESSINGS) ×3 IMPLANT
CLOTH BEACON ORANGE TIMEOUT ST (SAFETY) ×3 IMPLANT
DRSG OPSITE POSTOP 4X10 (GAUZE/BANDAGES/DRESSINGS) ×3 IMPLANT
ELECT REM PT RETURN 9FT ADLT (ELECTROSURGICAL) ×3
ELECTRODE REM PT RTRN 9FT ADLT (ELECTROSURGICAL) ×1 IMPLANT
EXTRACTOR VACUUM M CUP 4 TUBE (SUCTIONS) IMPLANT
EXTRACTOR VACUUM M CUP 4' TUBE (SUCTIONS)
GLOVE BIOGEL PI IND STRL 6.5 (GLOVE) ×1 IMPLANT
GLOVE BIOGEL PI IND STRL 7.0 (GLOVE) ×4 IMPLANT
GLOVE BIOGEL PI INDICATOR 6.5 (GLOVE) ×2
GLOVE BIOGEL PI INDICATOR 7.0 (GLOVE) ×8
GLOVE ECLIPSE 6.0 STRL STRAW (GLOVE) ×3 IMPLANT
GLOVE ECLIPSE 7.0 STRL STRAW (GLOVE) ×9 IMPLANT
GOWN STRL REUS W/TWL LRG LVL3 (GOWN DISPOSABLE) ×6 IMPLANT
HEMOSTAT SURGICEL 4X8 (HEMOSTASIS) ×3 IMPLANT
KIT ABG SYR 3ML LUER SLIP (SYRINGE) IMPLANT
NEEDLE HYPO 22GX1.5 SAFETY (NEEDLE) ×3 IMPLANT
NEEDLE HYPO 25X5/8 SAFETYGLIDE (NEEDLE) ×3 IMPLANT
NS IRRIG 1000ML POUR BTL (IV SOLUTION) ×3 IMPLANT
PACK C SECTION WH (CUSTOM PROCEDURE TRAY) ×3 IMPLANT
PAD ABD 7.5X8 STRL (GAUZE/BANDAGES/DRESSINGS) ×3 IMPLANT
PAD OB MATERNITY 4.3X12.25 (PERSONAL CARE ITEMS) ×3 IMPLANT
PENCIL SMOKE EVAC W/HOLSTER (ELECTROSURGICAL) ×3 IMPLANT
RTRCTR C-SECT PINK 25CM LRG (MISCELLANEOUS) IMPLANT
SPONGE GAUZE 4X4 12PLY STER LF (GAUZE/BANDAGES/DRESSINGS) ×6 IMPLANT
SUT PDS AB 0 CTX 36 PDP370T (SUTURE) IMPLANT
SUT PLAIN 2 0 XLH (SUTURE) IMPLANT
SUT VIC AB 0 CTX 36 (SUTURE) ×4
SUT VIC AB 0 CTX36XBRD ANBCTRL (SUTURE) ×2 IMPLANT
SUT VIC AB 2-0 CT1 27 (SUTURE) ×6
SUT VIC AB 2-0 CT1 TAPERPNT 27 (SUTURE) ×3 IMPLANT
SUT VIC AB 4-0 KS 27 (SUTURE) ×3 IMPLANT
SYR CONTROL 10ML LL (SYRINGE) ×3 IMPLANT
TOWEL OR 17X24 6PK STRL BLUE (TOWEL DISPOSABLE) ×3 IMPLANT
TRAY FOLEY W/BAG SLVR 14FR LF (SET/KITS/TRAYS/PACK) ×3 IMPLANT
WATER STERILE IRR 1000ML POUR (IV SOLUTION) ×3 IMPLANT

## 2019-04-11 NOTE — Transfer of Care (Signed)
Immediate Anesthesia Transfer of Care Note  Patient: Miranda Barnett  Procedure(s) Performed: CESAREAN SECTION WITH BILATERAL TUBAL LIGATION (Bilateral Abdomen)  Patient Location: PACU  Anesthesia Type:Spinal  Level of Consciousness: awake  Airway & Oxygen Therapy: Patient Spontanous Breathing  Post-op Assessment: Report given to RN  Post vital signs: Reviewed and stable  Last Vitals:  Vitals Value Taken Time  BP 136/89 04/11/19 1030  Temp    Pulse 95 04/11/19 1032  Resp 14 04/11/19 1032  SpO2 100 % 04/11/19 1032  Vitals shown include unvalidated device data.  Last Pain:  Vitals:   04/11/19 0821  TempSrc: Oral         Complications: No apparent anesthesia complications

## 2019-04-11 NOTE — Lactation Note (Signed)
This note was copied from a baby's chart. Lactation Consultation Note  Patient Name: Miranda Barnett [id number] Date: 04/11/2019  G3P3.  mom is an experienced breastfeeding mom.  Reports very challanging at first with all of her babies. Breastfed for 9 months with both. Mom trying to breastfeed on arrival.  Infant comes off and on the breast crying.  Will not maintain. Attempt hand expression.  Unable to hand express any breastmilk at this time. Assist.  After many attempts finally got infant to latch and left mom and baby breastfeeding,  Urged mom to continue to hand express/pump  and spoon feed back any emm past bf/call lactation as needed   Maternal Data    Feeding Feeding Type: Breast Fed  [facility] Score                   Interventions    Lactation Tools Discussed/Used     Consult Status      Early Steel Thompson Caul 04/11/2019, 11:08 PM

## 2019-04-11 NOTE — Anesthesia Postprocedure Evaluation (Signed)
Anesthesia Post Note  Patient: Henretta Holloway  Procedure(s) Performed: CESAREAN SECTION WITH BILATERAL TUBAL LIGATION (Bilateral Abdomen)     Patient location during evaluation: PACU Anesthesia Type: Spinal Level of consciousness: oriented and awake and alert Pain management: pain level controlled Vital Signs Assessment: post-procedure vital signs reviewed and stable Respiratory status: spontaneous breathing and respiratory function stable Cardiovascular status: blood pressure returned to baseline and stable Postop Assessment: no headache, no backache, no apparent nausea or vomiting and patient able to bend at knees Anesthetic complications: no    Last Vitals:  Vitals:   04/11/19 1045 04/11/19 1100  BP: 134/80 140/86  Pulse: 96 87  Resp: (!) 21 17  Temp:    SpO2: 100% 100%    Last Pain:  Vitals:   04/11/19 1028  TempSrc: Oral   Pain Goal:    LLE Motor Response: Purposeful movement (04/11/19 1100)   RLE Motor Response: Purposeful movement (04/11/19 1100)       Epidural/Spinal Function Cutaneous sensation: Able to Discern Pressure (04/11/19 1100), Patient able to flex knees: No (04/11/19 1100), Patient able to lift hips off bed: No (04/11/19 1100), Back pain beyond tenderness at insertion site: No (04/11/19 1100), Progressively worsening motor and/or sensory loss: No (04/11/19 1100), Bowel and/or bladder incontinence post epidural: No (04/11/19 1100)  Pervis Hocking

## 2019-04-11 NOTE — Discharge Summary (Signed)
Postpartum Discharge Summary    Patient Name: Miranda Barnett DOB: [birth year] MRN: [id number]  Date of admission: 04/11/2019 Delivering Provider: Verita Schneiders A   Date of discharge: 04/13/2019  Admitting diagnosis: Previous cesarean section [Z98.891] Intrauterine pregnancy: 108w0d    Secondary diagnosis:  Principal Problem:   S/P cesarean section (3rd) and bilateral salpingectomy Active Problems:   Supervision of high risk pregnancy, antepartum   AMA (advanced maternal age) multigravida 35+   Obesity in pregnancy   Preexisting diabetes complicating pregnancy, antepartum   History of 2 cesarean sections   Chronic hypertension affecting pregnancy   Unwanted fertility   GBS (group B Streptococcus carrier), +RV culture, currently pregnant  Additional problems: None     Discharge diagnosis: Term Pregnancy Delivered, CHTN and Type 2 DM                                                                                                Post partum procedures:bilateral salpingectomy  Augmentation: NA  Complications: None  Hospital course:  Sceduled C/S   43y.o. yo G3P3003 at 335w0das admitted to the hospital 04/11/2019 for scheduled cesarean section with the following indication:2 prior c-sections.  Membrane Rupture Time/Date: 9:31 AM ,04/11/2019   Patient delivered a Viable infant.04/11/2019  Details of operation can be found in separate operative note. Bilateral salpingectomy done. Patient was started on Enalapril 5 mg for BP control. Patient on insulin prior to pregnancy and insulin resumed at lower dose with good control, discharged on 15u NPH BID with sliding scale aspart with meals and plan to follow up with regular doctor. She is ambulating, tolerating a regular diet, passing flatus, and urinating well. Patient is discharged home in stable condition on  04/13/19        Delivery time: 9:31 AM    Magnesium Sulfate received: No BMZ received:  No Rhophylac:No MMR:No Transfusion:No  Physical exam  Vitals:   04/12/19 1420 04/12/19 2125 04/13/19 0527 04/13/19 0900  BP: 129/75 128/80 (!) 142/91 (!) 143/83  Pulse: 90 90 91 88  Resp: 18 18 18 18   Temp: 98.1 F (36.7 C) 98.2 F (36.8 C) 98.2 F (36.8 C)   TempSrc: Oral Oral Oral   SpO2: 98%     Weight:      Height:       General: alert, cooperative and no distress Lochia: appropriate Uterine Fundus: firm Incision: Dressing is clean, dry, and intact DVT Evaluation: No evidence of DVT seen on physical exam. No significant calf/ankle edema. Labs: Lab Results  Component Value Date   WBC 9.8 04/12/2019   HGB 10.6 (L) 04/12/2019   HCT 32.5 (L) 04/12/2019   MCV 95.3 04/12/2019   PLT 287 04/12/2019   CMP Latest Ref Rng & Units 04/11/2019  Glucose 70 - 99 mg/dL 85  BUN 6 - 20 mg/dL 5(L)  Creatinine 0.44 - 1.00 mg/dL 0.63  Sodium 135 - 145 mmol/L 137  Potassium 3.5 - 5.1 mmol/L 3.9  Chloride 98 - 111 mmol/L 105  CO2 22 - 32 mmol/L 24  Calcium 8.9 - 10.3 mg/dL 8.6(L)  Total Protein 6.5 -  8.1 g/dL 5.6(L)  Total Bilirubin 0.3 - 1.2 mg/dL 0.3  Alkaline Phos 38 - 126 U/L 60  AST 15 - 41 U/L 19  ALT 0 - 44 U/L 15    Discharge instruction: per After Visit Summary and "Baby and Me Booklet".  After visit meds:  Allergies as of 04/13/2019   No Known Allergies     Medication List    STOP taking these medications   aspirin EC 81 MG tablet   insulin lispro 100 UNIT/ML injection Commonly known as: HumaLOG     TAKE these medications   Accu-Chek FastClix Lancets Misc 1 Units by Percutaneous route 4 (four) times daily.   Accu-Chek Guide test strip Generic drug: glucose blood Use to check blood sugars four times a day was instructed   Accu-Chek Guide w/Device Kit 1 Device by Does not apply route 4 (four) times daily.   acetaminophen 325 MG tablet Commonly known as: TYLENOL Take 2 tablets (650 mg total) by mouth every 6 (six) hours as needed for mild pain  (temperature > 101.5.).   Blood Pressure Monitor Kit 1 kit by Does not apply route once a week. CHECK BP WEEKLY.  LARGE CUFF.  DX:  Z13.6         Z34.86   Comfort Fit Maternity Supp Lg Misc Wear daily when ambulating   enalapril 5 MG tablet Commonly known as: VASOTEC Take 1 tablet (5 mg total) by mouth daily. Start taking on: April 14, 2019   ibuprofen 800 MG tablet Commonly known as: ADVIL Take 1 tablet (800 mg total) by mouth every 8 (eight) hours.   insulin NPH Human 100 UNIT/ML injection Commonly known as: NOVOLIN N Inject 15 units in the morning and 15 units at bedtime What changed: additional instructions   oxyCODONE 5 MG immediate release tablet Commonly known as: Oxy IR/ROXICODONE Take 1-2 tablets (5-10 mg total) by mouth every 4 (four) hours as needed for moderate pain.   PNV PO Take 1 tablet by mouth daily.   polyethylene glycol powder 17 GM/SCOOP powder Commonly known as: GLYCOLAX/MIRALAX Take 255 g by mouth once for 1 dose.       Diet: carb modified diet  Activity: Advance as tolerated. Pelvic rest for 6 weeks.   Outpatient follow up:4 weeks Follow up Appt: Future Appointments  Date Time Provider [place]  04/19/2019 10:00 AM [place] None  05/09/2019  9:15 AM Sloan Leiter, MD [facility] None   Follow up Visit: Follow-up Information    [facility]. Schedule an appointment as soon as possible for a visit on 04/15/2019.            Please schedule this patient for Postpartum visit in: 4 weeks with the following provider: MD For C/S patients schedule nurse incision check in weeks 2 weeks: yes High risk pregnancy complicated by: HTN and L5QG Delivery mode:  CS Anticipated Birth Control:  salpingectomy PP Procedures needed: BP and incision check  Schedule Integrated BH visit: no      Newborn Data: Live born female  Birth Weight: 3700 grams  APGAR: 60, 9  Newborn Delivery   Birth date/time:  04/11/2019 09:31:00 Delivery type: C-Section, Low Transverse Trial of labor: No C-section categorization: Repeat      Baby Feeding: Breast Disposition:home with mother   04/13/2019 Clarnce Flock, MD

## 2019-04-11 NOTE — Progress Notes (Signed)
Dr Dione Plover notified of CBG and wants NPH to be given

## 2019-04-11 NOTE — Anesthesia Procedure Notes (Signed)
Spinal  Patient location during procedure: OR Start time: 04/11/2019 9:00 AM End time: 04/11/2019 9:06 AM Staffing Performed: anesthesiologist  Anesthesiologist: Pervis Hocking, DO Preanesthetic Checklist Completed: patient identified, IV checked, risks and benefits discussed, surgical consent, monitors and equipment checked, pre-op evaluation and timeout performed Spinal Block Patient position: sitting Prep: DuraPrep and site prepped and draped Patient monitoring: cardiac monitor, continuous pulse ox and blood pressure Approach: midline Location: L3-4 Injection technique: single-shot Needle Needle type: Pencan  Needle gauge: 24 G Needle length: 9 cm Assessment Sensory level: T6 Additional Notes Functioning IV was confirmed and monitors were applied. Sterile prep and drape, including hand hygiene and sterile gloves were used. The patient was positioned and the spine was prepped. The skin was anesthetized with lidocaine.  Free flow of clear CSF was obtained prior to injecting local anesthetic into the CSF.  The spinal needle aspirated freely following injection.  The needle was carefully withdrawn.  The patient tolerated the procedure well.

## 2019-04-11 NOTE — OR Nursing (Signed)
Spoke with dr Doroteo Glassman about pt bld pressure last two elevated 158/108 and 178/104. Dr Doroteo Glassman stated not to treat at this time.

## 2019-04-11 NOTE — Op Note (Signed)
Miranda Barnett PROCEDURE DATE: 04/11/2019  PREOPERATIVE DIAGNOSES: Intrauterine pregnancy at [redacted]w[redacted]d weeks gestation; Two previous cesarean sections; Chronic Hypertension; Type II Diabetes on insulin; Advanced Maternal Age; undesired fertility  POSTOPERATIVE DIAGNOSES: The same  PROCEDURE: Repeat Low Transverse Cesarean Section, Bilateral Salpingectomy  SURGEON:  Dr. Verita Schneiders  ASSISTANT:  Dr. Barrington Ellison,  Acquanetta Sit MSIII  ANESTHESIOLOGY TEAM: Anesthesiologist: Pervis Hocking, DO CRNA: Asher Muir, CRNA  INDICATIONS: Miranda Barnett is a 43 y.o. [phone] at [redacted]w[redacted]d here for cesarean section and bilateral tubal sterilization secondary to the indications listed under preoperative diagnoses; please see preoperative note for further details.  The risks of surgery were discussed with the patient including but were not limited to: bleeding which may require transfusion or reoperation; infection which may require antibiotics; injury to bowel, bladder, ureters or other surrounding organs; injury to the fetus; need for additional procedures including hysterectomy in the event of a life-threatening hemorrhage; placental abnormalities wth subsequent pregnancies, incisional problems, thromboembolic phenomenon and other postoperative/anesthesia complications.  Patient also desires permanent sterilization.  Other reversible forms of contraception were discussed with patient; she declines all other modalities. Risks of procedure discussed with patient including but not limited to: risk of regret, permanence of method, bleeding, infection, injury to surrounding organs and need for additional procedures.  Failure risk of  <1% with increased risk of ectopic gestation if pregnancy occurs was also discussed with patient.  The patient concurred with the proposed plan, giving informed written consent for the procedures.    FINDINGS:  Viable female infant in cephalic presentation.  Apgars pending at the  time of note, but newborn was stable in operating room.  Clear amniotic fluid.  Intact placenta, three vessel cord.  Normal uterus, fallopian tubes and ovaries bilaterally. Fallopian tubes were adherent to bowel (right side) ; also uterus and ovaries bilaterally. These adhesions were thin and easily lysed.  Bilateral salpingectomy performed. There was a little bleeding from the left pedicle, but ameliorated using suture ties.  Minimal intraperitoneal adhesive disease.  ANESTHESIA: Spinal ESTIMATED BLOOD LOSS: 600 ml SPECIMENS: Placenta sent to L&D.  Bilateral fallopian tube fragments sent to pathology COMPLICATIONS: None immediate  PROCEDURE IN DETAIL:  The patient preoperatively received intravenous antibiotics and had sequential compression devices applied to her lower extremities.   She was then taken to the operating room where spinal anesthesia was administered and was found to be adequate. She was then placed in a dorsal supine position with a leftward tilt, and prepped and draped in a sterile manner.  A foley catheter was placed into her bladder and attached to constant gravity.  After an adequate timeout was performed, a Pfannenstiel skin incision was made with scalpel over her preexisting scar and carried through to the underlying layer of fascia. The fascia was incised in the midline, and this incision was extended bilaterally using the Mayo scissors.  Kocher clamps were applied to the superior aspect of the fascial incision and the underlying rectus muscles were dissected off sharply.  A similar process was carried out on the inferior aspect of the fascial incision. The rectus muscles were separated in the midline and the peritoneum was entered bluntly. The Alexis self-retaining retractor was introduced into the abdominal cavity.  Attention was turned to the lower uterine segment where a low transverse hysterotomy was made with a scalpel and extended bilaterally bluntly.  The infant was  successfully delivered, the cord was clamped and cut after one minute, and the infant was handed over to  the awaiting neonatology team. Uterine massage was then administered, and the placenta delivered intact with a three-vessel cord. The uterus was then cleared of clots and debris.  The hysterotomy was closed with 0 Vicryl in a running locked fashion, and an imbricating layer was also placed with 0 Vicryl.  Attention was then turned to the fallopian tubes. The fallopian tubes were noted to be adherent to bowel (right side); also uterus and ovaries bilaterally. These adhesions were thin and easily lysed using electrocautery.  The right fallopian tube was grasped and followed out to the fimbriated end. The distal 5 cm portion of the tube and mesosalpinx was doubly clamped using Kelly clamps and the distal portion of the tube removed using Metzenbaum scissors. The remaining proximal portion was then similarly clamped, cut and suture ligated. The pedicle was inspected and found to be hemostatic.  A similar process was carried out on the left side allowing for bilateral salpingectomy.  There was a little bleeding from the left pedicle, but ameliorated using one more suture tie.  Surgicel was then wrapped around both adnexa.  The pelvis was cleared of all clot and debris. The retractor was removed.  The peritoneum and the rectus muscles were reapproximated using a 2-0 Vicryl interrupted stitch. The fascia was then closed using 0 PDS in a running fashion.  The subcutaneous layer was irrigated, reapproximated with 2-0 plain gut interrupted stitches, and the skin was closed with a 4-0 Vicryl subcuticular stitch. The patient tolerated the procedure well. Sponge, instrument and needle counts were correct x 3.  She was taken to the recovery room in stable condition.     Miranda Collins, MD, FACOG Obstetrician & Gynecologist, [facility] for [employer], [facility]

## 2019-04-11 NOTE — Lactation Note (Signed)
This note was copied from a baby's chart. Lactation Consultation Note  Patient Name: Miranda Barnett Date: 04/11/2019     Referral by RN.  Infant still having difficulty breastfeeding and with blood sugars. Assist/infant comes off and on the breast/attempt to prepump with manual/electric.  Attempt with 24 mm nipple shield.  Mom with large nipples so 24 mm nipple shield tight.  However infant does manitain more with nipple shield and does not continuously come off and on crying.  RN came and let us know blood sugar was 38 and infant needed to have gel.  Infant content past gel. Would open but just hold nipple in mouth. Left mom and baby STS.  Urged to call lactation as needed. Urged mom to continue to pump past breastfeedings for stimulation and to help nipples evert   Maternal Data    Feeding Feeding Type: Breast Fed  [facility] Score                   Interventions    Lactation Tools Discussed/Used     Consult Status      Miranda Barnett 04/11/2019, 11:02 PM

## 2019-04-11 NOTE — Anesthesia Preprocedure Evaluation (Addendum)
Anesthesia Evaluation  Patient identified by MRN, date of birth, ID band Patient awake    Reviewed: Allergy & Precautions, NPO status , Patient's Chart, lab work & pertinent test results  Airway Mallampati: III  TM Distance: >3 FB Neck ROM: Full    Dental no notable dental hx. (+) Teeth Intact, Dental Advisory Given   Pulmonary neg pulmonary ROS, former smoker,    Pulmonary exam normal breath sounds clear to auscultation       Cardiovascular hypertension, Normal cardiovascular exam Rhythm:Regular Rate:Normal  cHTN was previously on anti-hypertensives during pregnancy and then D/Ced due to normal/low Bps. Now severely hypertensive in preop   Neuro/Psych negative neurological ROS  negative psych ROS   GI/Hepatic negative GI ROS, Neg liver ROS,   Endo/Other  diabetes, Type 2, Insulin DependentMorbid obesityBMI 41  T2DM: Last A1c July 2020 7.2, insulin dependent prior to pregnancy. FS this AM 93, usually runs 70s-90s fasting. Symptomatic hypoglycemia when FS <70. No insuline this AM, took 1/2 normal long-acting dose last night and SS short-acting for PM meal  Renal/GU negative Renal ROS  negative genitourinary   Musculoskeletal negative musculoskeletal ROS (+)   Abdominal   Peds  Hematology negative hematology ROS (+) hct 36.2, active type and screen   Anesthesia Other Findings   Reproductive/Obstetrics (+) Pregnancy 3rd c section                           Anesthesia Physical Anesthesia Plan  ASA: III  Anesthesia Plan: Spinal   Post-op Pain Management:  Regional for Post-op pain   Induction:   PONV Risk Score and Plan: 3 and Ondansetron, Dexamethasone and Treatment may vary due to age or medical condition  Airway Management Planned: Natural Airway and Nasal Cannula  Additional Equipment: None  Intra-op Plan:   Post-operative Plan:   Informed Consent: I have reviewed the patients  History and Physical, chart, labs and discussed the procedure including the risks, benefits and alternatives for the proposed anesthesia with the patient or authorized representative who has indicated his/her understanding and acceptance.       Plan Discussed with: CRNA  Anesthesia Plan Comments: (Poorly controlled HTN- will treat if necessary after affects of spinal are demonstrated. No other signs/symptoms of pre-E, no severe features; magnesium if OBYGN sees fit.)      Anesthesia Quick Evaluation

## 2019-04-11 NOTE — H&P (Addendum)
Obstetric Preoperative History and Physical  Dustina Scoggin is a 43 y.o. [id number] with IUP at 55w0dpresenting for scheduled cesarean section.  Reports good fetal movement, no bleeding, no contractions, no leaking of fluid.  No acute preoperative concerns.    Cesarean Section Indication: 2 prior c-sections  Prenatal Course Source of Care: [facility] with onset of care at 15 weeks Pregnancy complications or risks: Patient Active Problem List   Diagnosis Date Noted  . GBS (group B Streptococcus carrier), +RV culture, currently pregnant 03/26/2019  . Unwanted fertility 02/21/2019  . Chronic hypertension affecting pregnancy 01/24/2019  . History of 2 cesarean sections 11/21/2018  . AMA (advanced maternal age) multigravida 35+ 11/09/2018  . Obesity in pregnancy 11/09/2018  . Unsp pre-existing diabetes in pregnancy, second trimester 11/09/2018  . Supervision of high risk pregnancy, antepartum 11/08/2018   She plans to breastfeed She desires BTL for postpartum contraception.   Prenatal labs and studies: ABO, Rh: --/--/A POS, A POS Performed at [facility] [id number] [place] [address] ([phone] Antibody: NEG (12/08 02951 Rubella: 3.36 (07/10 1112) RPR: NON REACTIVE (12/08 0919)  HBsAg: Negative (07/10 1112)  HIV: Non Reactive (09/24 0913)  GBS:--/Positive (11/19 0108) No GTT: pre-existing DM Genetic screening normal Anatomy UKoreanormal  Prenatal Transfer Tool  Maternal Diabetes: Yes:  Diabetes Type:  Pre-pregnancy Genetic Screening: Normal Maternal Ultrasounds/Referrals: Normal Fetal Ultrasounds or other Referrals:  None Maternal Substance Abuse:  No Significant Maternal Medications:  None Significant Maternal Lab Results: Group B Strep positive  Past Medical History:  Diagnosis Date  . Diabetes mellitus [place]   . Hypertension     Past Surgical History:  Procedure Laterality Date  . CESAREAN SECTION      OB History  Gravida Para  Term Preterm AB Living  3 2 2     2   SAB TAB Ectopic Multiple Live Births          2    # Outcome Date GA Lbr Len/2nd Weight Sex Delivery Anes PTL Lv  3 Current           2 Term 12/03/16 [id number] g F CS-LTranv Spinal N LIV     Birth Comments: PP PIH  1 Term 07/14/09 [id number] g F CS-LTranv Spinal  LIV     Complications: Failure to Progress in Second Stage    Social History   Socioeconomic History  . Marital status: Single    Spouse name: Not on file  . Number of children: Not on file  . Years of education: Not on file  . Highest education level: Not on file  Occupational History  . Not on file  Tobacco Use  . Smoking status: Former Smoker    Types: Cigarettes  . Smokeless tobacco: Never Used  . Tobacco comment: prior to +UPT   Substance and Sexual Activity  . Alcohol use: Never  . Drug use: Never  . Sexual activity: Yes    Birth control/protection: None  Other Topics Concern  . Not on file  Social History Narrative  . Not on file   Social Determinants of Health   Financial Resource Strain:   . Difficulty of Paying Living Expenses: Not on file  Food Insecurity:   . Worried About RunCharity fundraiser the Last Year: Not on file  . Ran Out of Food in the Last Year: Not on file  Transportation Needs:   . Lack of Transportation (Medical):  Not on file  . Lack of Transportation (Non-Medical): Not on file  Physical Activity:   . Days of Exercise per Week: Not on file  . Minutes of Exercise per Session: Not on file  Stress:   . Feeling of Stress : Not on file  Social Connections:   . Frequency of Communication with Friends and Family: Not on file  . Frequency of Social Gatherings with Friends and Family: Not on file  . Attends Religious Services: Not on file  . Active Member of Clubs or Organizations: Not on file  . Attends Archivist Meetings: Not on file  . Marital Status: Not on file    Family History  Problem Relation Age of Onset  . Diabetes  Mother   . Hypertension Mother     Medications Prior to Admission  Medication Sig Dispense Refill Last Dose  . aspirin EC 81 MG tablet Take 1 tablet (81 mg total) by mouth daily. 90 tablet 5   . glucose blood (ACCU-CHEK GUIDE) test strip Use to check blood sugars four times a day was instructed 50 each 12 04/10/2019 at Unknown time  . insulin lispro (HUMALOG) 100 UNIT/ML injection Inject 36 units with breakfast and 32 units with supper/evening meal (Patient taking differently: Inject 38-40 Units into the skin See admin instructions. Inject 40 units subcutaneously with breakfast and inject 38 units subcutaneously with supper/evening meal) 10 mL 11 04/10/2019 at Unknown time  . insulin NPH Human (NOVOLIN N) 100 UNIT/ML injection Inject 38 units in the morning and 36 units at bedtime (Patient taking differently: Inject 38-42 Units into the skin See admin instructions. Inject 42 units subcutaneously in the morning and inject 38-40 units subcutaneously at bedtime) 10 mL 3 04/10/2019 at Unknown time  . Prenatal Vit w/Fe-Methylfol-FA (PNV PO) Take 1 tablet by mouth daily.    04/10/2019 at Unknown time  . Accu-Chek FastClix Lancets MISC 1 Units by Percutaneous route 4 (four) times daily. 100 each 12   . Blood Glucose Monitoring Suppl (ACCU-CHEK GUIDE) w/Device KIT 1 Device by Does not apply route 4 (four) times daily. 1 kit 0   . Blood Pressure Monitor KIT 1 kit by Does not apply route once a week. CHECK BP WEEKLY.  LARGE CUFF.  DX:  Z13.6         Z34.86 1 kit 0   . Elastic Bandages & Supports (COMFORT FIT MATERNITY SUPP LG) MISC Wear daily when ambulating 1 each 0     No Known Allergies  Review of Systems: Pertinent items noted in HPI and remainder of comprehensive ROS otherwise negative.  Physical Exam: BP (!) 158/108 Comment: talked to anesthesia does not want to treat pressure at this time  Pulse (!) 113   Temp 98.4 F (36.9 C) (Oral)   Resp 18   Ht 5' 8"  (1.727 m)   Wt 124.7 kg   LMP  07/26/2018 (Approximate)   SpO2 99%   BMI 41.81 kg/m  FHR by Doppler: 142 bpm CONSTITUTIONAL: Well-developed, well-nourished female in no acute distress.  HENT:  Normocephalic, atraumatic, External right and left ear normal. Oropharynx is clear and moist EYES: Conjunctivae and EOM are normal. No scleral icterus.  NECK: Normal range of motion, supple, no masses SKIN: Skin is warm and dry. No rash noted. Not diaphoretic. No erythema. No pallor. [place]: Alert and oriented to person, place, and time. Normal reflexes, muscle tone coordination. No cranial nerve deficit noted. PSYCHIATRIC: Normal mood and affect. Normal behavior. Normal judgment and  thought content. CARDIOVASCULAR: Normal heart rate noted RESPIRATORY: Effort and breath sounds normal, no problems with respiration noted ABDOMEN: Soft, nontender, nondistended, gravid. Well-healed Pfannenstiel incision. PELVIC: Deferred MUSCULOSKELETAL: Normal range of motion. No edema and no tenderness. 2+ distal pulses.   Pertinent Labs/Studies:   Results for orders placed or performed during the hospital encounter of 04/11/19 (from the past 72 hour(s))  Glucose, capillary     Status: None   Collection Time: 04/11/19  8:24 AM  Result Value Ref Range   Glucose-Capillary 93 70 - 99 mg/dL    Assessment and Plan: Tyana Butzer is a 43 y.o. G3P2002 at 85w0dbeing admitted for scheduled cesarean section. The risks of cesarean section were discussed with the patient including but were not limited to: bleeding which may require transfusion or reoperation; infection which may require antibiotics; injury to bowel, bladder, ureters or other surrounding organs; injury to the fetus; need for additional procedures including hysterectomy in the event of a life-threatening hemorrhage; placental abnormalities wth subsequent pregnancies, incisional problems, thromboembolic phenomenon and other postoperative/anesthesia complications.  Patient also desires  permanent sterilization.  Other reversible forms of contraception were discussed with patient; she declines all other modalities. Risks of procedure discussed with patient including but not limited to: risk of regret, permanence of method, bleeding, infection, injury to surrounding organs and need for additional procedures.  Failure risk of about 1% with increased risk of ectopic gestation if pregnancy occurs was also discussed with patient.  Also discussed possibility of post-tubal pain syndrome. The patient concurred with the proposed plan, giving informed written consent for the procedures.  Patient has been NPO since midnight she will remain NPO for procedure. Anesthesia and OR aware.  Preoperative prophylactic antibiotics and SCDs ordered on call to the OR.  To OR when ready.  HTN: Patient with known cHTN currently not on medications. Will monitor BP's closely and likely initiated medication post-partum. Pre-E labs pending. Asymptomatic. T2DM: A1c 7.2 on 11/09/2018. Patient reports taking ~ 36u with breakfast and 32u with dinner. Novolin 38u AM and 36u PM. Will restart with sliding scale and lower dosing post-partum. Contraception: Desires Bilateral tubal sterilization MOF: Breast  CBarrington Ellison MD [facility] Center[facility] Medicine Fellow, F[facility] Hospitalfor [facility]De[employer] CLeon

## 2019-04-12 LAB — GLUCOSE, CAPILLARY
Glucose-Capillary: 58 mg/dL — ABNORMAL LOW (ref 70–99)
Glucose-Capillary: 80 mg/dL (ref 70–99)
Glucose-Capillary: 109 mg/dL — ABNORMAL HIGH (ref 70–99)
Glucose-Capillary: 89 mg/dL (ref 70–99)
Glucose-Capillary: 135 mg/dL — ABNORMAL HIGH (ref 70–99)

## 2019-04-12 LAB — BIRTH TISSUE RECOVERY COLLECTION (PLACENTA DONATION)

## 2019-04-12 LAB — CBC
HCT: 32.5 % — ABNORMAL LOW (ref 36.0–46.0)
Hemoglobin: 10.6 g/dL — ABNORMAL LOW (ref 12.0–15.0)
MCH: 31.1 pg (ref 26.0–34.0)
MCHC: 32.6 g/dL (ref 30.0–36.0)
MCV: 95.3 fL (ref 80.0–100.0)
Platelets: 287 10*3/uL (ref 150–400)
RBC: 3.41 MIL/uL — ABNORMAL LOW (ref 3.87–5.11)
RDW: 14.6 % (ref 11.5–15.5)
WBC: 9.8 10*3/uL (ref 4.0–10.5)
nRBC: 0 % (ref 0.0–0.2)

## 2019-04-12 LAB — SURGICAL PATHOLOGY

## 2019-04-12 NOTE — Progress Notes (Signed)
Patient's CBG before breakfast is 58.  Patient is asymptomatic.  Insulin is due, MD was called and orders were received to hold a.m. insulin.  Patient informed, is to eat breakfast (tray has arrived).

## 2019-04-12 NOTE — Lactation Note (Signed)
This note was copied from a baby's chart. Lactation Consultation Note  Patient Name: Miranda Barnett [id number] Date: 04/12/2019 Reason for consult: Follow-up assessment;Term  P3 mother whose infant is now 66 hours old.  Mother breast fed her other two children (now 41 years old and 85 years old) for 9 months each.  Mother had baby latched to the left breast when I arrived.  Baby was swaddled and the latch was shallow.  Offered to assist and mother accepted.  Suggested mother remove the blanket and feed STS.  Mother removed the blanket.  Assisted to latch to the left breast and baby latched with a wide gape after a couple attempts.  Once at the breast, she began to suck.  Demonstrated breast compressions for mother.  Reminded her to keep her fingers away from areola and nipple and to lift and support her breast for baby.  Encouraged a deeper latch.  Mother denied pain with latching.  She was still feeding when I left the room.  She will continue to feed with cues and at least 8-12 times/24 hours.  She will call for latch assistance as needed.  RN in room and aware.  Mother has rented a pump but is a [facility] participant in [place].  Explained the [facility] pump program and referral faxed.  Mother will call the [facility] office on Monday morning to determine pump eligibility.    Maternal Data Formula Feeding for Exclusion: No Has patient been taught Hand Expression?: Yes Does the patient have breastfeeding experience prior to this delivery?: Yes  Feeding Feeding Type: Breast Fed  LATCH Score Latch: Grasps breast easily, tongue down, lips flanged, rhythmical sucking.  Audible Swallowing: None  Type of Nipple: Everted at rest and after stimulation(short shafted)  Comfort (Breast/Nipple): Soft / non-tender  Hold (Positioning): Assistance needed to correctly position infant at breast and maintain latch.  LATCH Score: 7  Interventions Interventions: Breast feeding basics reviewed;Assisted with  latch;Skin to skin;Breast massage;Breast compression;Hand pump;Position options;Support pillows  Lactation Tools Discussed/Used WIC Program: Yes   Consult Status Consult Status: Follow-up Date: 04/13/19 Follow-up type: In-patient    Little Ishikawa 04/12/2019, 4:39 PM

## 2019-04-12 NOTE — Progress Notes (Signed)
Dr Dione Plover notified that pt partially removed honeycomb when removing pressure dressing.  Honeycomb dressing change ordered

## 2019-04-12 NOTE — Progress Notes (Addendum)
POSTPARTUM PROGRESS NOTE  Subjective: Miranda Barnett is a 43 y.o. G3P3003 s/p rLTCS at [redacted]w[redacted]d  She reports she doing well. No acute events overnight. She denies any problems with ambulating, voiding or po intake. Denies nausea or vomiting. She has  passed flatus. Pain is well controlled.  Lochia is appropriate.  Objective: Blood pressure 135/79, pulse 71, temperature 98.2 F (36.8 C), temperature source Oral, resp. rate 17, height _0  (1.727 m), weight 124.7 kg, last menstrual period 07/26/2018, SpO2 100 %, unknown if currently breastfeeding.  Physical Exam:  General: alert, cooperative and no distress Chest: no respiratory distress Abdomen: soft, mild tenderness at incision site, dressing dry and intact  Uterine Fundus: firm, appropriately tender Extremities: No calf swelling or tenderness  no edema  Recent Labs    04/11/19 1036 04/12/19 0402  HGB 11.5* 10.6*  HCT 35.1* 32.5*    Assessment/Plan: Miranda Lachapelleis a 43y.o. G3P3003 s/p rLTCS at 344w0dor scheduled C-Section .  Routine Postpartum Care: Doing well, pain well-controlled.  -- Continue routine care, lactation support  -- Contraception: BTL -- Feeding: Breast  Dispo: Plan for discharge 24-48hrs.  Miranda Barnett PGMilfordor WoFairburnersonally saw and evaluated the patient, performing the key elements of the service. I developed and verified the management plan that is described in the resident's/student's note, and I agree with the content with my edits above. VSS, HRR&R, Resp unlabored, Legs neg.  FrNigel BertholdCNM 04/15/2019 10:54 AM

## 2019-04-13 LAB — GLUCOSE, CAPILLARY
Glucose-Capillary: 52 mg/dL — ABNORMAL LOW (ref 70–99)
Glucose-Capillary: 60 mg/dL — ABNORMAL LOW (ref 70–99)
Glucose-Capillary: 72 mg/dL (ref 70–99)

## 2019-04-13 MED ORDER — ENALAPRIL MALEATE 5 MG PO TABS: 5.0000 mg | ORAL_TABLET | Freq: Every day | ORAL | 2 refills | Status: DC

## 2019-04-13 MED ORDER — INSULIN NPH (HUMAN) (ISOPHANE) 100 UNIT/ML ~~LOC~~ SUSP: SUBCUTANEOUS | 3 refills | Status: DC

## 2019-04-13 MED ORDER — ACETAMINOPHEN 325 MG PO TABS: 650.0000 mg | ORAL_TABLET | Freq: Four times a day (QID) | ORAL | 0 refills | Status: DC | PRN

## 2019-04-13 MED ORDER — IBUPROFEN 800 MG PO TABS: 800.0000 mg | ORAL_TABLET | Freq: Three times a day (TID) | ORAL | 0 refills | Status: DC

## 2019-04-13 MED ORDER — OXYCODONE HCL 5 MG PO TABS: 5.0000 mg | ORAL_TABLET | ORAL | 0 refills | Status: DC | PRN

## 2019-04-13 MED ORDER — POLYETHYLENE GLYCOL 3350 17 GM/SCOOP PO POWD: 1.0000 | Freq: Once | ORAL | 0 refills | Status: AC

## 2019-04-13 NOTE — Lactation Note (Signed)
This note was copied from a baby's chart. Lactation Consultation Note  Patient Name: Miranda Barnett Date: 04/13/2019 Reason for consult: Follow-up assessment  P3 mother whose infant is now 110 hours old.  Mother breast fed her other two children (now 41 years old and 56 years old) for 9 months each.  Baby has a 10% weight loss this morning.  Mother was hoping to be discharged today, however, this will not be an option due to infant's weight loss.  Mother feels like baby has latched well but has been somewhat sleepy.    Mother has been presented with the option on using donor breast milk as opposed to giving formula for supplementation.  Benefits discussed and informed mother about the donor screening process.  After discussing this option with mother she wishes to proceed with the donor milk.  RN updated.  Order placed and I printed labels for the milk.  RN will begin using the donor milk at the next feeding which will be at 1200 or sooner if baby awakens and shows cues.  Asked mother to feed baby at least every three hour due to the weight loss and to awaken her if she remains sleeping at the three hour interval.  Mother verbalized understanding.  Mother has not pumped consistently.  Discussed the importance of pumping after every feeding to help stimulate breasts and establish a milk supply.  Mother will plan to do this.  She will call for latch assistance as needed.  She has rented a DEBP for after discharge and will contact the [facility] office on Monday to determine pump eligibility.  Father present.  RN updated.   Maternal Data    Feeding Feeding Type: Breast Fed  LATCH Score Latch: Repeated attempts needed to sustain latch, nipple held in mouth throughout feeding, stimulation needed to elicit sucking reflex.  Audible Swallowing: A few with stimulation  Type of Nipple: Everted at rest and after stimulation  Comfort (Breast/Nipple): Soft / non-tender  Hold (Positioning): No  assistance needed to correctly position infant at breast.  LATCH Score: 8  Interventions    Lactation Tools Discussed/Used     Consult Status Consult Status: Follow-up Date: 04/14/19 Follow-up type: In-patient    Nyaira Hodgens R Syrena Burges 04/13/2019, 11:04 AM

## 2019-04-13 NOTE — Progress Notes (Signed)
RN Gwenlyn Saran notified of glucose level.

## 2019-04-13 NOTE — Discharge Instructions (Signed)
For your diabetes medications: Continue checking your blood sugars four times a day Continue taking NPH insulin 15 units in the morning, and 15 units in the evening Continue to take sliding scale insulin with meals Do NOT take scheduled insulin with meals Call your regular doctor to schedule an appointment for follow up of your diabetes   Cesarean Delivery, Care After This sheet gives you information about how to care for yourself after your procedure. Your health care provider may also give you more specific instructions. If you have problems or questions, contact your health care provider. What can I expect after the procedure? After the procedure, it is common to have:  A small amount of blood or clear fluid coming from the incision.  Some redness, swelling, and pain in your incision area.  Some abdominal pain and soreness.  Vaginal bleeding (lochia). Even though you did not have a vaginal delivery, you will still have vaginal bleeding and discharge.  Pelvic cramps.  Fatigue. You may have pain, swelling, and discomfort in the tissue between your vagina and your anus (perineum) if:  Your C-section was unplanned, and you were allowed to labor and push.  An incision was made in the area (episiotomy) or the tissue tore during attempted vaginal delivery. Follow these instructions at home: Incision care   Follow instructions from your health care provider about how to take care of your incision. Make sure you: ? Wash your hands with soap and water before you change your bandage (dressing). If soap and water are not available, use hand sanitizer. ? If you have a dressing, change it or remove it as told by your health care provider. ? Leave stitches (sutures), skin staples, skin glue, or adhesive strips in place. These skin closures may need to stay in place for 2 weeks or longer. If adhesive strip edges start to loosen and curl up, you may trim the loose edges. Do not remove adhesive  strips completely unless your health care provider tells you to do that.  Check your incision area every day for signs of infection. Check for: ? More redness, swelling, or pain. ? More fluid or blood. ? Warmth. ? Pus or a bad smell.  Do not take baths, swim, or use a hot tub until your health care provider says it's okay. Ask your health care provider if you can take showers.  When you cough or sneeze, hug a pillow. This helps with pain and decreases the chance of your incision opening up (dehiscing). Do this until your incision heals. Medicines  Take over-the-counter and prescription medicines only as told by your health care provider.  If you were prescribed an antibiotic medicine, take it as told by your health care provider. Do not stop taking the antibiotic even if you start to feel better.  Do not drive or use heavy machinery while taking prescription pain medicine. Lifestyle  Do not drink alcohol. This is especially important if you are breastfeeding or taking pain medicine.  Do not use any products that contain nicotine or tobacco, such as cigarettes, e-cigarettes, and chewing tobacco. If you need help quitting, ask your health care provider. Eating and drinking  Drink at least 8 eight-ounce glasses of water every day unless told not to by your health care provider. If you breastfeed, you may need to drink even more water.  Eat high-fiber foods every day. These foods may help prevent or relieve constipation. High-fiber foods include: ? Whole grain cereals and breads. ? Brown rice. ?  Beans. ? Fresh fruits and vegetables. Activity   If possible, have someone help you care for your baby and help with household activities for at least a few days after you leave the hospital.  Return to your normal activities as told by your health care provider. Ask your health care provider what activities are safe for you.  Rest as much as possible. Try to rest or take a nap while your  baby is sleeping.  Do not lift anything that is heavier than 10 lbs (4.5 kg), or the limit that you were told, until your health care provider says that it is safe.  Talk with your health care provider about when you can engage in sexual activity. This may depend on your: ? Risk of infection. ? How fast you heal. ? Comfort and desire to engage in sexual activity. General instructions  Do not use tampons or douches until your health care provider approves.  Wear loose, comfortable clothing and a supportive and well-fitting bra.  Keep your perineum clean and dry. Wipe from front to back when you use the toilet.  If you pass a blood clot, save it and call your health care provider to discuss. Do not flush blood clots down the toilet before you get instructions from your health care provider.  Keep all follow-up visits for you and your baby as told by your health care provider. This is important. Contact a health care provider if:  You have: ? A fever. ? Bad-smelling vaginal discharge. ? Pus or a bad smell coming from your incision. ? Difficulty or pain when urinating. ? A sudden increase or decrease in the frequency of your bowel movements. ? More redness, swelling, or pain around your incision. ? More fluid or blood coming from your incision. ? A rash. ? Nausea. ? Little or no interest in activities you used to enjoy. ? Questions about caring for yourself or your baby.  Your incision feels warm to the touch.  Your breasts turn red or become painful or hard.  You feel unusually sad or worried.  You vomit.  You pass a blood clot from your vagina.  You urinate more than usual.  You are dizzy or light-headed. Get help right away if:  You have: ? Pain that does not go away or get better with medicine. ? Chest pain. ? Difficulty breathing. ? Blurred vision or spots in your vision. ? Thoughts about hurting yourself or your baby. ? New pain in your abdomen or in one of  your legs. ? A severe headache.  You faint.  You bleed from your vagina so much that you fill more than one sanitary pad in one hour. Bleeding should not be heavier than your heaviest period. Summary  After the procedure, it is common to have pain at your incision site, abdominal cramping, and slight bleeding from your vagina.  Check your incision area every day for signs of infection.  Tell your health care provider about any unusual symptoms.  Keep all follow-up visits for you and your baby as told by your health care provider. This information is not intended to replace advice given to you by your health care provider. Make sure you discuss any questions you have with your health care provider. Document Released: 01/08/2002 Document Revised: 10/25/2017 Document Reviewed: 10/25/2017 Elsevier Patient Education  2020 [employer].

## 2019-04-14 ENCOUNTER — Ambulatory Visit: Payer: Self-pay

## 2019-04-14 NOTE — Lactation Note (Signed)
This note was copied from a baby's chart. Lactation Consultation Note  Patient Name: Miranda Barnett SUORV'I Date: 04/14/2019 Reason for consult: Follow-up assessment;Infant weight loss;Term Baby is 72 hours old/11% weight loss.  Baby lost 1% in past 24 hours.  Mom states she is more confident with latching baby since [facility] assist last night.  She is post pumping and last obtained 60 mls.  She plans on giving baby supplement after next feeding.  Breasts are full.  Mom has a symphony pump for home use.  Instructed to continue post pumping and supplementing.  Baby has a weight check tomorrow morning.  Reviewed outpatient services and encouraged to call prn.  Maternal Data    Feeding    LATCH Score                   Interventions    Lactation Tools Discussed/Used     Consult Status Consult Status: Complete Follow-up type: Call as needed    Ave Filter 04/14/2019, 11:04 AM

## 2019-04-14 NOTE — Lactation Note (Addendum)
This note was copied from a baby's chart. Lactation Consultation Note  Patient Name: Miranda Barnett [id number] Date: 04/14/2019 Reason for consult: Initial assessment;Term;Infant weight loss(-10% weight loss) P3, 66 hour female infant with -10% weight loss. Per mom, infant doesn't latch well on her left breast, she is off and on breast, mom has large inverted nipples. Infant latches well on the right breast without difficulties.  LC assisted mom in latching infant on her  left breast using the football hold position, LC had mom do a tee cup hold, infant was able to latch without difficulties with depth, swallows observed. Infant sustained latch for 20 minutes. While mom was using DEBP after latching infant at breast, LC assisted dad in supplementing infant with 18 mls of EBM and 12 mls of donor breast milk using a foley cup total intake was 30 mls or 1 ounce.  Infant appeared content after feeding. Mom will continue working on infant having  a deeper latch and sustaining latch on left breast. Mom knows to call Nurse or [facility] if she has any further questions, concerns or need assistance with latching infant at breast.   Maternal Data    Feeding    LATCH Score Latch: Grasps breast easily, tongue down, lips flanged, rhythmical sucking.  Audible Swallowing: Spontaneous and intermittent  Type of Nipple: Inverted  Comfort (Breast/Nipple): Soft / non-tender  Hold (Positioning): Assistance needed to correctly position infant at breast and maintain latch.  LATCH Score: 7  Interventions Interventions: Support pillows;Adjust position;Position options;Expressed milk;Assisted with latch;Skin to skin  Lactation Tools Discussed/Used     Consult Status Consult Status: Follow-up Date: 04/14/19 Follow-up type: In-patient    Vicente Serene 04/14/2019, 4:29 AM

## 2019-04-19 ENCOUNTER — Ambulatory Visit: Payer: Medicaid Other

## 2019-04-22 ENCOUNTER — Ambulatory Visit: Payer: Medicaid Other

## 2019-04-22 ENCOUNTER — Other Ambulatory Visit: Payer: Self-pay

## 2019-04-22 VITALS — BP 140/94 | HR 76

## 2019-04-22 DIAGNOSIS — Z0130 Encounter for examination of blood pressure without abnormal findings: Secondary | ICD-10-CM

## 2019-04-22 NOTE — Progress Notes (Signed)
Patient seen and assessed by nursing staff during this encounter. I have reviewed the chart and agree with the documentation and plan.  Rayhaan Huster, MD 04/22/2019 11:47 AM    

## 2019-04-22 NOTE — Progress Notes (Signed)
..  Subjective:  Miranda Barnett is a 43 y.o. female here for BP check.   Hypertension ROS: not taking medications regularly as instructed, no TIA's, no chest pain on exertion, no dyspnea on exertion and no swelling of ankles.    Objective:  BP (!) 140/94   Pulse 76   Appearance alert, well appearing, and in no distress. General exam BP noted to be well controlled today in office.    Assessment:   Blood Pressure poorly controlled.   Plan:  Advised pt to start taking prescribed BP med, pt previoulsy declined because she did not feel it was safe while breastfeeding. Pt agreed to start today, incision is clean and shows no signs of infection, will follow up at pp visit on 05-08-18.Marland Kitchen

## 2019-05-09 ENCOUNTER — Encounter: Payer: Self-pay | Admitting: Obstetrics

## 2019-05-09 ENCOUNTER — Ambulatory Visit (INDEPENDENT_AMBULATORY_CARE_PROVIDER_SITE_OTHER): Payer: Medicaid Other | Admitting: Obstetrics

## 2019-05-09 ENCOUNTER — Ambulatory Visit: Payer: Medicaid Other | Admitting: Obstetrics and Gynecology

## 2019-05-09 ENCOUNTER — Other Ambulatory Visit: Payer: Self-pay

## 2019-05-09 DIAGNOSIS — E139 Other specified diabetes mellitus without complications: Secondary | ICD-10-CM

## 2019-05-09 DIAGNOSIS — E66813 Obesity, class 3: Secondary | ICD-10-CM

## 2019-05-09 DIAGNOSIS — I10 Essential (primary) hypertension: Secondary | ICD-10-CM

## 2019-05-09 DIAGNOSIS — Z1389 Encounter for screening for other disorder: Secondary | ICD-10-CM | POA: Diagnosis not present

## 2019-05-09 MED ORDER — HYDROCHLOROTHIAZIDE 25 MG PO TABS: 25.0000 mg | ORAL_TABLET | Freq: Every day | ORAL | 11 refills | Status: DC

## 2019-05-09 NOTE — Progress Notes (Signed)
Post Partum Exam  Miranda Barnett is a 44 y.o. G21P3003 female who presents for a postpartum visit. She is 4 weeks postpartum following a low cervical transverse Cesarean section. I have fully reviewed the prenatal and intrapartum course. The delivery was at 39 gestational weeks.  Anesthesia: spinal. Postpartum course has been complicated by hypertension and diabetes. Baby's course has been uncomplicated. Baby is feeding by breast. Bleeding thin lochia. Bowel function is normal. Bladder function is normal. Patient is not sexually active. Contraception method is tubal ligation. Postpartum depression screening:neg, score 8.  The following portions of the patient's history were reviewed and updated as appropriate: allergies, current medications, past family history, past medical history, past social history, past surgical history and problem list. Last pap smear done 11/09/2018 and was Normal  Review of Systems A comprehensive review of systems was negative.    Objective:  Blood pressure (!) 142/88, pulse 70, weight 264 lb (119.7 kg), unknown if currently breastfeeding.  General:  alert and no distress   Breasts:  inspection negative, no nipple discharge or bleeding, no masses or nodularity palpable  Lungs: clear to auscultation bilaterally  Heart:  regular rate and rhythm, S1, S2 normal, no murmur, click, rub or gallop  Abdomen: soft, non-tender; bowel sounds normal; no masses,  no organomegaly and incision is clean, dry and intact    Assessment:   1. Postpartum care following cesarean delivery - doing well  2. Diabetes 1.5, managed as type 2 (HCC)  3. HTN (hypertension), benign Rx: - hydrochlorothiazide (HYDRODIURIL) 25 MG tablet; Take 1 tablet (25 mg total) by mouth daily.  Dispense: 30 tablet; Refill: 11  4. Class 3 severe obesity due to excess calories without serious comorbidity with body mass index (BMI) of 45.0 to 49.9 in adult [facility]) - program of caloric restriction, exercise and  behavioral modification recommended    Plan:   1. Contraception: tubal ligation 2. Follow up with PCP 3. Follow up in: 4 weeks or as needed.   Brock Bad, MD 05/09/2019 12:45 PM

## 2019-05-09 NOTE — Progress Notes (Signed)
Pt needs to discuss diabetes and insulin she is on. Pt states she has still been having some swelling and feels her BP has still been elevated. BP stable today. EDD score 8.

## 2019-06-06 ENCOUNTER — Telehealth (INDEPENDENT_AMBULATORY_CARE_PROVIDER_SITE_OTHER): Payer: Medicaid Other | Admitting: Obstetrics

## 2019-06-06 ENCOUNTER — Encounter: Payer: Self-pay | Admitting: Obstetrics

## 2019-06-06 DIAGNOSIS — Z1389 Encounter for screening for other disorder: Secondary | ICD-10-CM | POA: Diagnosis not present

## 2019-06-06 DIAGNOSIS — Z0000 Encounter for general adult medical examination without abnormal findings: Secondary | ICD-10-CM

## 2019-06-06 DIAGNOSIS — I10 Essential (primary) hypertension: Secondary | ICD-10-CM

## 2019-06-06 DIAGNOSIS — E66813 Obesity, class 3: Secondary | ICD-10-CM

## 2019-06-06 DIAGNOSIS — Z6841 Body Mass Index (BMI) 40.0 and over, adult: Secondary | ICD-10-CM

## 2019-06-06 DIAGNOSIS — E139 Other specified diabetes mellitus without complications: Secondary | ICD-10-CM

## 2019-06-06 NOTE — Progress Notes (Signed)
Subjective:     Miranda Barnett is a 44 y.o. female who presents for a virtual MyChart postpartum visit. She is 7 weeks postpartum following a low cervical transverse Cesarean section. I have fully reviewed the prenatal and intrapartum course. The delivery was at 39 gestational weeks. Outcome: repeat cesarean section, low transverse incision. Anesthesia: spinal. Postpartum course has been normal. Baby's course has been normal. Baby is feeding by breast. Bleeding no bleeding. Bowel function is normal. Bladder function is normal. Patient is not sexually active. Contraception method is bilateral salpingectomy. Postpartum depression screening: negative.  Tobacco, alcohol and substance abuse history reviewed.  Adult immunizations reviewed including TDAP, rubella and varicella.  The following portions of the patient's history were reviewed and updated as appropriate: allergies, current medications, past family history, past medical history, past social history, past surgical history and problem list.  Review of Systems A comprehensive review of systems was negative.   Objective:    BP 137/90   Pulse 85   Breastfeeding Yes    PE:      General:  Alert and no distress      Respiratory:  Normal respiratory effort      Cardiovascular:  Normal      Mental:  Normal affect and mood The remainder of the physical exam was deferred due to the nature of the visit.   >50% of 15 min visit spent on counseling and coordination of care.  Assessment:     1. Postpartum care following cesarean delivery - doing well  2. Diabetes 1.5, managed as type 2 (HCC) - stable clinically.  Managed by PCP  3. HTN (hypertension), benign - stable clinically.  Managed by PCP  4. Class 3 severe obesity due to excess calories without serious comorbidity with body mass index (BMI) of 45.0 to 49.9 in adult [facility]) - program of caloric restriction, exercise and behavioral modification recommended  5. Routine adult health  maintenance - patient to call and make appointment with PCP for routine health maintenance   Plan:    1. Contraception: bilateral salpingectomy - done with C/S 2. Continue PNV's 3. Follow up in: 6 months or as needed.    Brock Bad, MD 06/06/2019 9:54 AM

## 2019-06-06 NOTE — Progress Notes (Signed)
Pt is following up after pp visit on 05-09-19. Currently taking BP med and breastfeeding, denies any abnormal symptoms.  Edinburgh= 7

## 2019-08-21 ENCOUNTER — Other Ambulatory Visit: Payer: Self-pay | Admitting: Family Medicine

## 2019-08-21 DIAGNOSIS — O24312 Unspecified pre-existing diabetes mellitus in pregnancy, second trimester: Secondary | ICD-10-CM

## 2019-10-17 DIAGNOSIS — E1165 Type 2 diabetes mellitus with hyperglycemia: Secondary | ICD-10-CM | POA: Diagnosis not present

## 2019-10-17 DIAGNOSIS — H538 Other visual disturbances: Secondary | ICD-10-CM | POA: Diagnosis not present

## 2019-10-30 DIAGNOSIS — H5213 Myopia, bilateral: Secondary | ICD-10-CM | POA: Diagnosis not present

## 2019-12-23 IMAGING — US US MFM FETAL BPP W/O NON-STRESS
1 series · 15 of 28 positions shown · non-contrast
Comparison: none

[Series 1: us mfm fetal bpp w/o non-stress · 49 acquisitions, 15 frames shown]
[im 1/49]
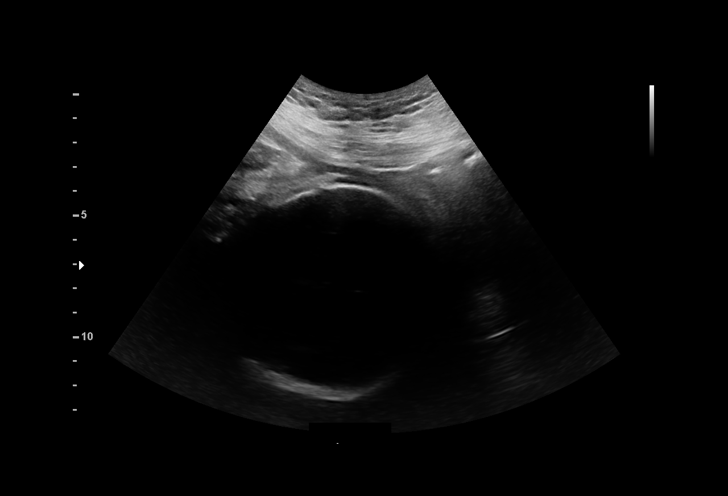
[im 4/49]
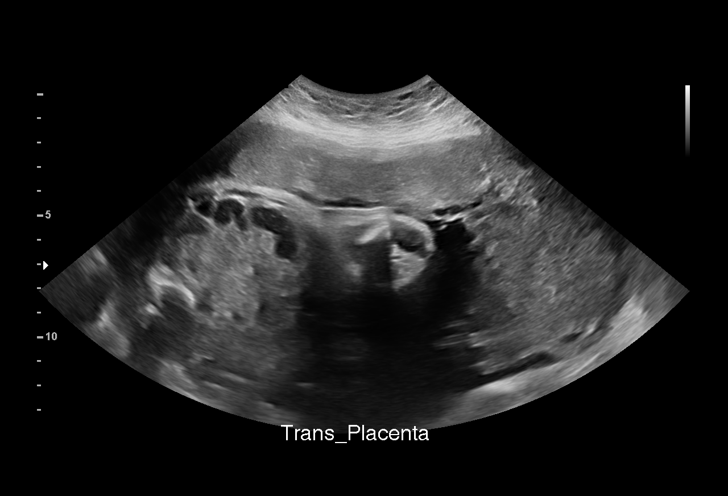
[im 8/49]
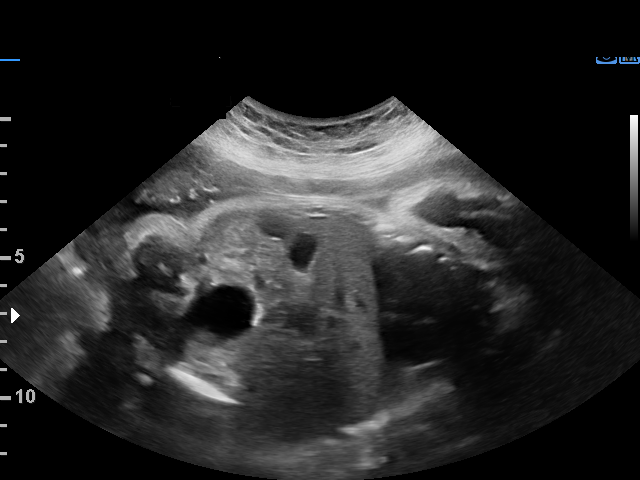
[im 11/49]
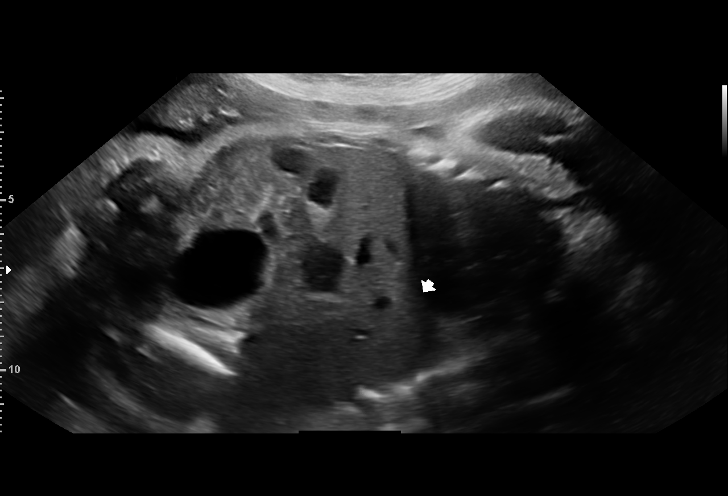
[im 15/49]
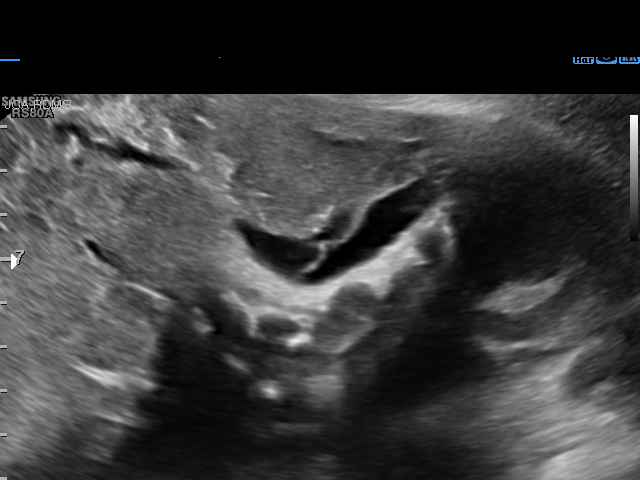
[im 18/49]
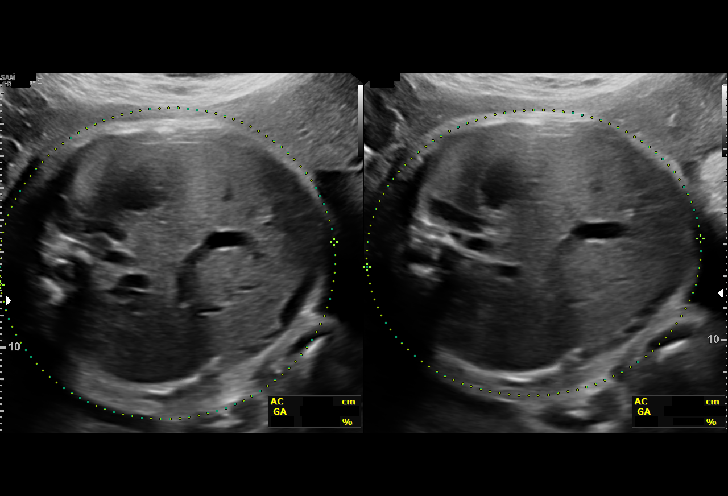
[im 22/49]
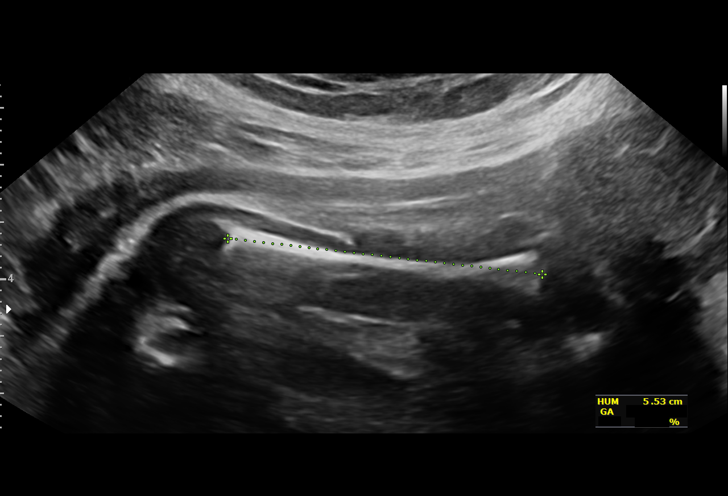
[im 25/49]
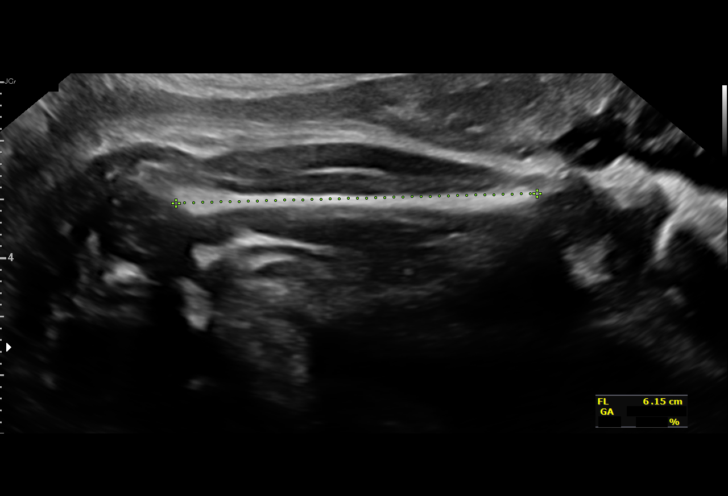
[im 27/49]
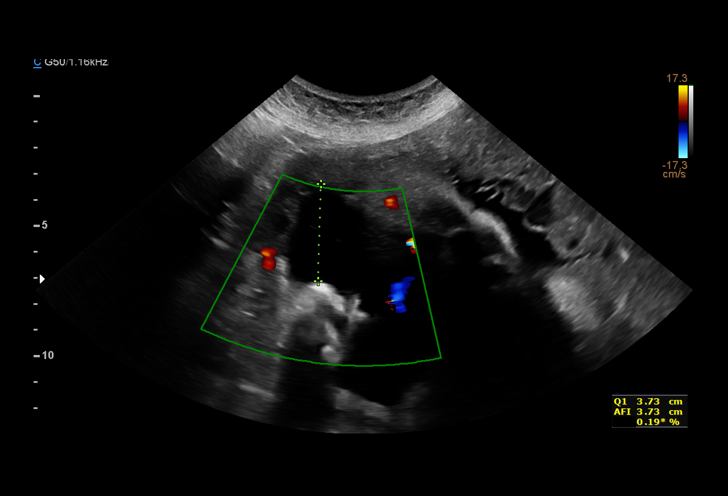
[im 31/49]
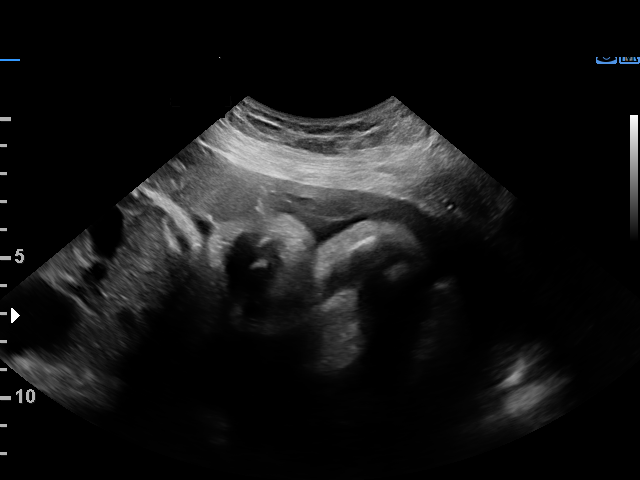
[im 34/49]
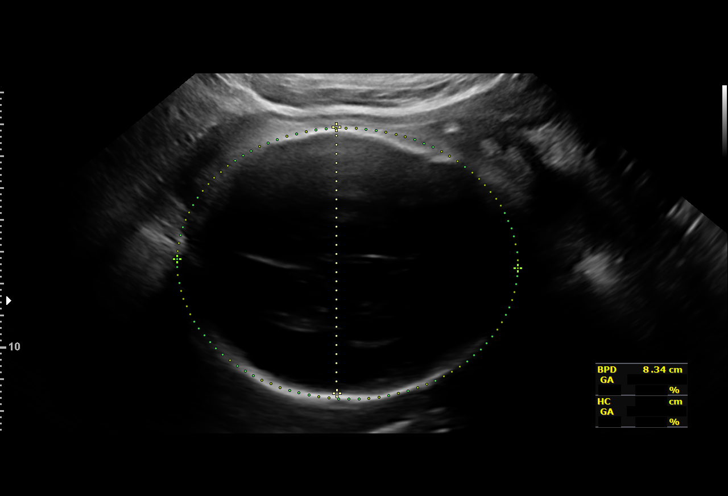
[im 38/49]
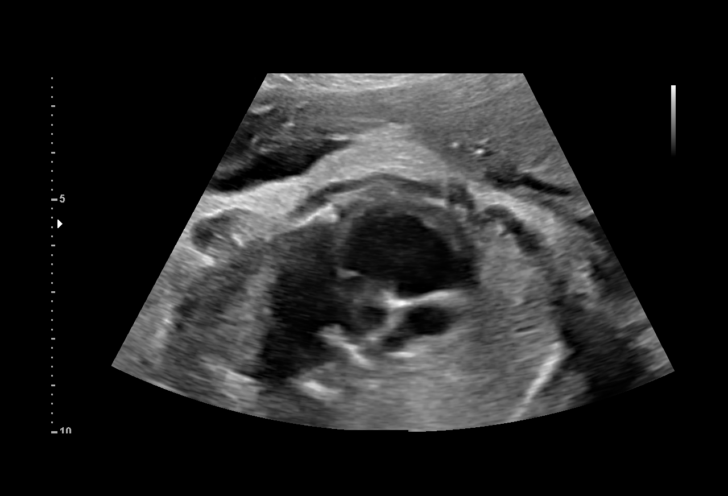
[im 41/49]
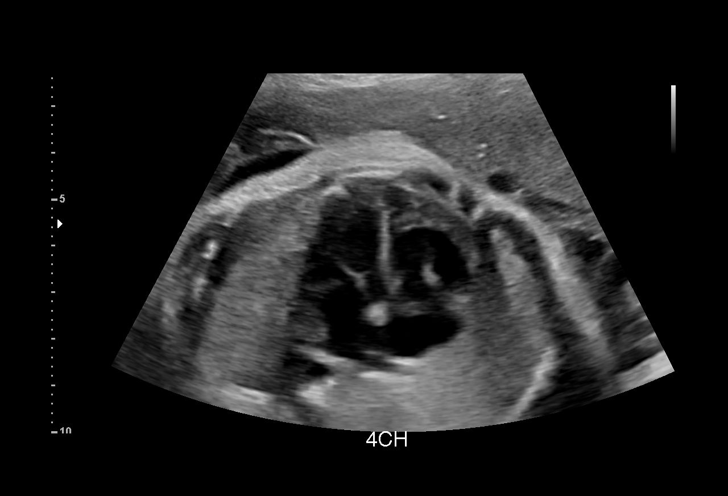
[im 45/49]
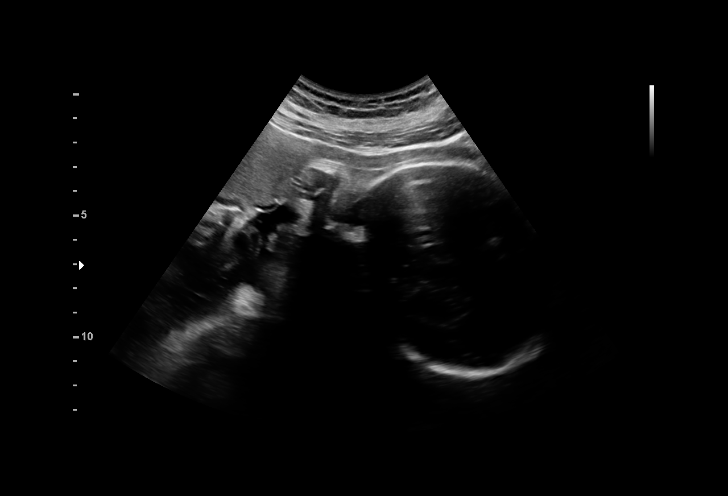
[im 49/49]
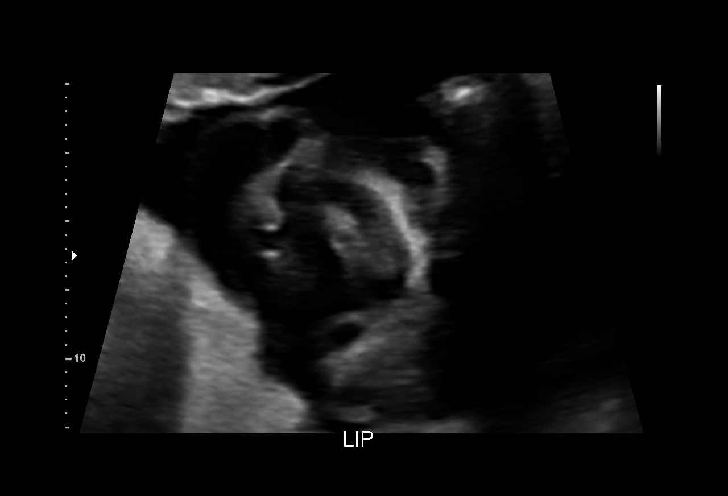

[15 of 28 positions shown; findings below may reference images not displayed]

----------------------------------------------------------------------

 ----------------------------------------------------------------------
Indications

  Pre-existing diabetes, type 2, in pregnancy,
  third trimester (insulin, a1c 7.2)
  Hypertension - Chronic/Pre-existing
  Advanced maternal age multigravida 35+,
  third trimester
  Obesity complicating pregnancy, third
  trimester
  Previous cesarean delivery, antepartum x 2
  33 weeks gestation of pregnancy
 ----------------------------------------------------------------------
Vital Signs

                                                Height:        5'8"
Fetal Evaluation

 Num Of Fetuses:         1
 Fetal Heart Rate(bpm):  134
 Cardiac Activity:       Observed

 Amniotic Fluid
 AFI FV:      Within normal limits

 AFI Sum(cm)     %Tile       Largest Pocket(cm)
 14.48           51

 RUQ(cm)       RLQ(cm)       LUQ(cm)        LLQ(cm)

Biophysical Evaluation

 Amniotic F.V:   Within normal limits       F. Tone:        Observed
 F. Movement:    Observed                   Score:          [DATE]
 F. Breathing:   Observed
Biometry

 BPD:      83.8  mm     G. Age:  33w 5d         66  %    CI:        76.61   %    70 - 86
                                                         FL/HC:      20.4   %    19.9 -
 HC:      303.3  mm     G. Age:  33w 5d         30  %    HC/AC:      0.99        0.96 -
 AC:      307.1  mm     G. Age:  34w 5d         90  %    FL/BPD:     73.9   %    71 - 87
 FL:       61.9  mm     G. Age:  32w 1d         17  %    FL/AC:      20.2   %    20 - 24
 HUM:      55.5  mm     G. Age:  32w 2d         44  %

 LV:        2.3  mm

 Est. FW:    9945  gm           5 lb     65  %
Gestational Age

 U/S Today:     33w 4d                                        EDD:   04/14/19
 Best:          33w 0d     Det. By:  Previous Ultrasound      EDD:   04/18/19
                                     (09/25/18)
Comments

 This patient was seen for a follow up growth scan due to
 pregestational diabetes and chronic hypertension.  She
 denies any problems since her last exam.
 She was informed that the fetal growth and amniotic fluid
 level appears appropriate for her gestational age.
 A biophysical profile performed today was [DATE].
 A follow-up exam for fetal testing was scheduled in 1 week.

## 2020-01-01 DIAGNOSIS — H5213 Myopia, bilateral: Secondary | ICD-10-CM | POA: Diagnosis not present

## 2020-01-13 DIAGNOSIS — Z20822 Contact with and (suspected) exposure to covid-19: Secondary | ICD-10-CM | POA: Diagnosis not present

## 2020-03-16 ENCOUNTER — Other Ambulatory Visit: Payer: Self-pay | Admitting: Obstetrics

## 2020-05-13 ENCOUNTER — Other Ambulatory Visit: Payer: Self-pay | Admitting: Family Medicine

## 2020-05-13 ENCOUNTER — Other Ambulatory Visit: Payer: Self-pay | Admitting: Obstetrics

## 2020-05-13 ENCOUNTER — Other Ambulatory Visit: Payer: Self-pay | Admitting: Nurse Practitioner

## 2020-05-13 DIAGNOSIS — O24312 Unspecified pre-existing diabetes mellitus in pregnancy, second trimester: Secondary | ICD-10-CM

## 2020-05-13 DIAGNOSIS — I10 Essential (primary) hypertension: Secondary | ICD-10-CM

## 2020-07-02 ENCOUNTER — Encounter: Payer: Self-pay | Admitting: Internal Medicine

## 2020-07-02 ENCOUNTER — Ambulatory Visit: Payer: Medicaid Other | Admitting: Internal Medicine

## 2020-07-02 ENCOUNTER — Other Ambulatory Visit: Payer: Self-pay

## 2020-07-02 VITALS — BP 137/88 | HR 100 | Temp 98.6°F | Ht 68.5 in | Wt 233.2 lb

## 2020-07-02 DIAGNOSIS — N926 Irregular menstruation, unspecified: Secondary | ICD-10-CM

## 2020-07-02 DIAGNOSIS — I10 Essential (primary) hypertension: Secondary | ICD-10-CM | POA: Diagnosis not present

## 2020-07-02 DIAGNOSIS — E669 Obesity, unspecified: Secondary | ICD-10-CM | POA: Insufficient documentation

## 2020-07-02 DIAGNOSIS — E119 Type 2 diabetes mellitus without complications: Secondary | ICD-10-CM

## 2020-07-02 DIAGNOSIS — Z794 Long term (current) use of insulin: Secondary | ICD-10-CM

## 2020-07-02 DIAGNOSIS — O24319 Unspecified pre-existing diabetes mellitus in pregnancy, unspecified trimester: Secondary | ICD-10-CM | POA: Diagnosis not present

## 2020-07-02 DIAGNOSIS — E1169 Type 2 diabetes mellitus with other specified complication: Secondary | ICD-10-CM | POA: Insufficient documentation

## 2020-07-02 LAB — POCT GLYCOSYLATED HEMOGLOBIN (HGB A1C): Hemoglobin A1C: 12 % — AB (ref 4.0–5.6)

## 2020-07-02 LAB — GLUCOSE, CAPILLARY: Glucose-Capillary: 398 mg/dL — ABNORMAL HIGH (ref 70–99)

## 2020-07-02 MED ORDER — METFORMIN HCL 500 MG PO TABS: 500.0000 mg | ORAL_TABLET | Freq: Two times a day (BID) | ORAL | 11 refills | Status: DC

## 2020-07-02 MED ORDER — INSULIN NPH (HUMAN) (ISOPHANE) 100 UNIT/ML ~~LOC~~ SUSP: 40.0000 [IU] | Freq: Two times a day (BID) | SUBCUTANEOUS | 3 refills | Status: DC

## 2020-07-02 MED ORDER — EMPAGLIFLOZIN 10 MG PO TABS: 10.0000 mg | ORAL_TABLET | Freq: Every day | ORAL | 11 refills | Status: DC

## 2020-07-02 MED ORDER — "INSULIN SYRINGE-NEEDLE U-100 31G X 15/64"" 0.3 ML MISC": 40.0000 [IU] | Freq: Two times a day (BID) | 3 refills | Status: DC

## 2020-07-02 MED ORDER — HYDROCHLOROTHIAZIDE 25 MG PO TABS: 25.0000 mg | ORAL_TABLET | Freq: Every day | ORAL | 11 refills | Status: DC

## 2020-07-02 MED ORDER — INSULIN LISPRO 100 UNIT/ML ~~LOC~~ SOLN: 30.0000 [IU] | Freq: Three times a day (TID) | SUBCUTANEOUS | 3 refills | Status: DC

## 2020-07-02 NOTE — Assessment & Plan Note (Signed)
Miranda Barnett is a 45 yo F w/ PMH of gestational diabetes and hypertension presenting to [facility] with complaint of irregular cycles with dysmenorrhea. She was in her usual state of health until about 3 months ago when she began to have irregular cycles (once every 2-3 weeks with dribbling) with painful menstrual cramps and bloating. She mentions she had stopped breast-feeding around this time after her child's birth 14 months ago as well as getting tubal ligation earlier in the year. She denies any foul discharge, fevers, chills, hot flashes. Currently sexually active with monogamous partner.  A/P Present with irregular menstrual cycle after cessation of breast feeding. Discussed her symptoms likely being sequelae of hormonal changes after pregnancy and lactation and treating supportively for now with tylenol, exercise, heat pads. Can consider starting oral contraceptives for dysmenorrhea if symptoms persist. Also advised to f/u with ob/gyn for check-up. - Referral to ob/gyn - Treat supportively with heat pads, regular exercise, tylenol

## 2020-07-02 NOTE — Assessment & Plan Note (Addendum)
BP Readings from Last 3 Encounters:  07/02/20 137/88  06/06/19 137/90  05/09/19 (!) 142/88   Above goal this visit. Currently on HCTZ 25mg  daily. Denies significant side effects.  A/P Need additional anti-hypertensive if persistently elevated. Considering first visit to clinic, white coat hypertension may be playing a role. - C/w HCTZ 25mg  daily - If persistently, elevated can start ACE/ARB - BMP today

## 2020-07-02 NOTE — Assessment & Plan Note (Addendum)
Mentions she was previously pre-diabetic with hgb a1c of 5.8. Previously on oral meds only but switched to insulin when she found out she was pregnant. Currently using novolin 40 units BID and humalog 30 units with meals. Mentions seeing elevated blood sugar recently. She has been running low on insulin due to not having a primary care provider. Denies any hypoglycemic episodes.  A/P Unfortunately pre-diabetes developed to gestational diabetes during pregnancy and is not in full uncontrolled diabetes. Would like to switch to oral regimen but hgb a1c very high at 12.0 this visit. Will start oral regimen and if diabetes is better controlled, can wean off insulin. - C/w novolin 40 units BID, humalog 30 units TID qc for now - Start metformin 500mg  and titrate up to max dose - Start empagliflozin 10mg  daily - May benefit from CGM monitoring if diabetes continues to be uncontrolled - Diabetic foot exam performed this visit

## 2020-07-02 NOTE — Progress Notes (Signed)
CC: Menstrual cramps  HPI: [name] is a 45 y.o. with PMH listed below presenting with complaint of menstrual cramps. Please see problem based assessment and plan for further details.  Past Medical History:  Diagnosis Date  . Diabetes mellitus without complication (HCC)   . Hypertension    Family History Multiple family members have diabetes, cardiac issues. Denies any family history of malignancy  Social History Used to work for [employer]. 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 Lives with two kids and fiance. Denies tobacco, illicit drug use. Drinks occasionally on weekends on social events.  Review of Systems: Review of Systems  Constitutional: Negative for chills, fever and malaise/fatigue.  Eyes: Negative for blurred vision.  Respiratory: Negative for shortness of breath.   Cardiovascular: Negative for chest pain and palpitations.  Gastrointestinal: Negative for constipation, diarrhea, nausea and vomiting.  Neurological: Negative for dizziness.    Physical Exam:     Vitals:   07/02/20 1429  BP: 137/88  Pulse: 100  Temp: 98.6 F (37 C)  TempSrc: Oral  Weight: 233 lb 3.2 oz (105.8 kg)  Height: 5' 8.5" (1.74 m)   Gen: Well-developed, well nourished, NAD HEENT: NCAT head, hearing intact CV: RRR, S1, S2 normal Pulm: CTAB, No rales, no wheezes Abd: NTND, BS+,  Extm: ROM intact, Peripheral pulses intact, No peripheral edema Skin: Dry, Warm, normal turgor  Assessment & Plan:   Chronic hypertension BP Readings from Last 3 Encounters:  07/02/20 137/88  06/06/19 137/90  05/09/19 (!) 142/88   Above goal this visit. Currently on HCTZ 25mg  daily. Denies significant side effects.  A/P Need additional anti-hypertensive if persistently elevated. Considering first visit to clinic, white coat hypertension may be playing a role. - C/w HCTZ 25mg  daily - If persistently, elevated can start ACE/ARB - BMP today  Type 2 diabetes mellitus without complication, with long-term  current use of insulin (HCC) Mentions she was previously pre-diabetic with hgb a1c of 5.8. Previously on oral meds only but switched to insulin when she found out she was pregnant. Currently using novolin 40 units BID and humalog 30 units with meals. Mentions seeing elevated blood sugar recently. She has been running low on insulin due to not having a primary care provider. Denies any hypoglycemic episodes.  A/P Unfortunately pre-diabetes developed to gestational diabetes during pregnancy and is not in full uncontrolled diabetes. Would like to switch to oral regimen but hgb a1c very high at 12.0 this visit. Will start oral regimen and if diabetes is better controlled, can wean off insulin. - C/w novolin 40 units BID, humalog 30 units TID qc for now - Start metformin 500mg  and titrate up to max dose - Start empagliflozin 10mg  daily - May benefit from CGM monitoring if diabetes continues to be uncontrolled - Diabetic foot exam performed this visit  Irregular menstrual cycle [name] is a 45 yo F w/ PMH of gestational diabetes and hypertension presenting to [facility] with complaint of irregular cycles with dysmenorrhea. She was in her usual state of health until about 3 months ago when she began to have irregular cycles (once every 2-3 weeks with dribbling) with painful menstrual cramps and bloating. She mentions she had stopped breast-feeding around this time after her child's birth 14 months ago as well as getting tubal ligation earlier in the year. She denies any foul discharge, fevers, chills, hot flashes. Currently sexually active with monogamous partner.  A/P Present with irregular menstrual cycle after cessation of breast feeding. Discussed her symptoms likely being sequelae of hormonal changes  after pregnancy and lactation and treating supportively for now with tylenol, exercise, heat pads. Can consider starting oral contraceptives for dysmenorrhea if symptoms persist. Also advised to f/u with ob/gyn  for check-up. - Referral to ob/gyn - Treat supportively with heat pads, regular exercise, tylenol   Patient discussed with Dr. Mikey Bussing  -Judeth Cornfield, PGY3 [facility] Internal Medicine Pager: [phone]

## 2020-07-03 LAB — CBC
Hematocrit: 43.1 % (ref 34.0–46.6)
Hemoglobin: 14.3 g/dL (ref 11.1–15.9)
MCH: 30 pg (ref 26.6–33.0)
MCHC: 33.2 g/dL (ref 31.5–35.7)
MCV: 90 fL (ref 79–97)
Platelets: 319 10*3/uL (ref 150–450)
RBC: 4.77 x10E6/uL (ref 3.77–5.28)
RDW: 12.1 % (ref 11.7–15.4)
WBC: 9 10*3/uL (ref 3.4–10.8)

## 2020-07-03 LAB — BMP8+ANION GAP
Anion Gap: 18 mmol/L (ref 10.0–18.0)
BUN/Creatinine Ratio: 10 (ref 9–23)
BUN: 8 mg/dL (ref 6–24)
CO2: 21 mmol/L (ref 20–29)
Calcium: 9.8 mg/dL (ref 8.7–10.2)
Chloride: 92 mmol/L — ABNORMAL LOW (ref 96–106)
Creatinine, Ser: 0.84 mg/dL (ref 0.57–1.00)
Glucose: 423 mg/dL — ABNORMAL HIGH (ref 65–99)
Potassium: 4.1 mmol/L (ref 3.5–5.2)
Sodium: 131 mmol/L — ABNORMAL LOW (ref 134–144)
eGFR: 89 mL/min/{1.73_m2} (ref >59)

## 2020-07-03 NOTE — Progress Notes (Signed)
Internal Medicine Clinic Attending  Case discussed with Dr. Lee  At the time of the visit.  We reviewed the resident's history and exam and pertinent patient test results.  I agree with the assessment, diagnosis, and plan of care documented in the resident's note.    

## 2020-08-10 ENCOUNTER — Other Ambulatory Visit (HOSPITAL_COMMUNITY)
Admission: RE | Admit: 2020-08-10 | Discharge: 2020-08-10 | Disposition: A | Discharge status: Home / Self Care | Payer: Medicaid Other | Source: Ambulatory Visit | Attending: Obstetrics | Admitting: Obstetrics

## 2020-08-10 ENCOUNTER — Other Ambulatory Visit: Payer: Self-pay

## 2020-08-10 ENCOUNTER — Ambulatory Visit (INDEPENDENT_AMBULATORY_CARE_PROVIDER_SITE_OTHER): Payer: Medicaid Other | Admitting: Obstetrics

## 2020-08-10 ENCOUNTER — Encounter: Payer: Self-pay | Admitting: Obstetrics

## 2020-08-10 VITALS — BP 157/97 | HR 90 | Ht 68.5 in | Wt 238.0 lb

## 2020-08-10 DIAGNOSIS — Z01419 Encounter for gynecological examination (general) (routine) without abnormal findings: Secondary | ICD-10-CM

## 2020-08-10 DIAGNOSIS — N898 Other specified noninflammatory disorders of vagina: Secondary | ICD-10-CM | POA: Diagnosis not present

## 2020-08-10 DIAGNOSIS — Z113 Encounter for screening for infections with a predominantly sexual mode of transmission: Secondary | ICD-10-CM

## 2020-08-10 DIAGNOSIS — R030 Elevated blood-pressure reading, without diagnosis of hypertension: Secondary | ICD-10-CM | POA: Diagnosis not present

## 2020-08-10 DIAGNOSIS — I10 Essential (primary) hypertension: Secondary | ICD-10-CM | POA: Diagnosis not present

## 2020-08-10 DIAGNOSIS — R300 Dysuria: Secondary | ICD-10-CM

## 2020-08-10 DIAGNOSIS — N939 Abnormal uterine and vaginal bleeding, unspecified: Secondary | ICD-10-CM

## 2020-08-10 DIAGNOSIS — R102 Pelvic and perineal pain unspecified side: Secondary | ICD-10-CM

## 2020-08-10 DIAGNOSIS — Z1239 Encounter for other screening for malignant neoplasm of breast: Secondary | ICD-10-CM | POA: Diagnosis not present

## 2020-08-10 DIAGNOSIS — E139 Other specified diabetes mellitus without complications: Secondary | ICD-10-CM

## 2020-08-10 MED ORDER — IBUPROFEN 800 MG PO TABS: 800.0000 mg | ORAL_TABLET | Freq: Three times a day (TID) | ORAL | 5 refills | Status: DC | PRN

## 2020-08-10 NOTE — Progress Notes (Addendum)
GYN presents for AEX/PAP.  C/o irregular bleeding sometimes for 2-3 weeks at a time, pelvic pain 5-7/10 x 5 months, brownish thick discharge, fishy odor, itching. BP elevated today, she has not taken her medications yet.  PHQ-9=11

## 2020-08-10 NOTE — Progress Notes (Signed)
Subjective:        Miranda Barnett is a 45 y.o. female here for a routine exam.  Current complaints: Irregular menstrual cycles.  Malodorous vaginal discharge and urinary frequency and discomfort.  Personal health questionnaire:  Is patient Miranda Barnett, have a family history of breast and/or ovarian cancer: no Is there a family history of uterine cancer diagnosed at age < 19, gastrointestinal cancer, urinary tract cancer, family member who is a Field seismologist syndrome-associated carrier: no Is the patient overweight and hypertensive, family history of diabetes, personal history of gestational diabetes, preeclampsia or PCOS: yes Is patient over 29, have PCOS,  family history of premature CHD under age 81, diabetes, smoke, have hypertension or peripheral artery disease:  no At any time, has a partner hit, kicked or otherwise hurt or frightened you?: no Over the past 2 weeks, have you felt down, depressed or hopeless?: no Over the past 2 weeks, have you felt little interest or pleasure in doing things?:no   Gynecologic History Patient's last menstrual period was 07/07/2020. Contraception: none Last Pap: 2020. Results were: normal Last mammogram: none.  1st mammogram recently ordered by PCP.   Obstetric History OB History  Gravida Para Term Preterm AB Living  _0 SAB IAB Ectopic Multiple Live Births        0 3    # Outcome Date GA Lbr Len/2nd Weight Sex Delivery Anes PTL Lv  3 Term 04/11/19 [redacted]w[redacted]d 8 lb 2.5 oz (3.7 kg) F CS-LTranv Spinal  LIV  2 Term 12/03/16 [id number] lb 4 oz (3.742 kg) F CS-LTranv Spinal N LIV     Birth Comments: PP PIH  1 Term 07/14/09 4054w0d lb (3.629 kg) F CS-LTranv Spinal  LIV     Complications: Failure to Progress in Second Stage    Past Medical History:  Diagnosis Date  . Diabetes mellitus without complication ([place] . Hypertension     Past Surgical History:  Procedure Laterality Date  . CESAREAN SECTION    . CESAREAN SECTION WITH BILATERAL  TUBAL LIGATION Bilateral 04/11/2019   Procedure: CESAREAN SECTION WITH BILATERAL TUBAL LIGATION;  Surgeon: AnyOsborne OmanD;  Location: [facility] LD [facility]S;  Service: Obstetrics;  Laterality: Bilateral;     Current Outpatient Medications:  .  hydrochlorothiazide (HYDRODIURIL) 25 MG tablet, Take 1 tablet (25 mg total) by mouth daily., Disp: 30 tablet, Rfl: 11 .  ibuprofen (ADVIL) 800 MG tablet, Take 1 tablet (800 mg total) by mouth every 8 (eight) hours as needed., Disp: 30 tablet, Rfl: 5 .  insulin lispro (HUMALOG) 100 UNIT/ML injection, Inject 0.3 mLs (30 Units total) into the skin 3 (three) times daily with meals. Inject 30 units with meals, Disp: 30 mL, Rfl: 3 .  insulin NPH Human (NOVOLIN N) 100 UNIT/ML injection, Inject 0.4 mLs (40 Units total) into the skin in the morning and at bedtime., Disp: 20 mL, Rfl: 3 .  Insulin Syringe-Needle U-100 31G X 15/64" 0.3 ML MISC, 40 Units by Does not apply route in the morning and at bedtime., Disp: 200 each, Rfl: 3 .  metFORMIN (GLUCOPHAGE) 500 MG tablet, Take 1 tablet (500 mg total) by mouth 2 (two) times daily with a meal. Increase to 1000m72mter 1 week as long as GI symptoms are tolerable, Disp: 60 tablet, Rfl: 11 .  Accu-Chek FastClix Lancets MISC, USE 4 TIMES A DAY AS DIRECTED, Disp: 102 each, Rfl: 12 .  Blood  Glucose Monitoring Suppl (ACCU-CHEK GUIDE) w/Device KIT, 1 Device by Does not apply route 4 (four) times daily., Disp: 1 kit, Rfl: 0 .  Blood Pressure Monitor KIT, 1 kit by Does not apply route once a week. CHECK BP WEEKLY.  LARGE CUFF.  DX:  Z13.6         Z34.86, Disp: 1 kit, Rfl: 0 .  empagliflozin (JARDIANCE) 10 MG TABS tablet, Take 1 tablet (10 mg total) by mouth daily before breakfast., Disp: 30 tablet, Rfl: 11 .  glucose blood (ACCU-CHEK GUIDE) test strip, USE TO CHECK BLOOD SUGARS FOUR TIMES A DAY *WAS INSTRUCTED*, Disp: 50 strip, Rfl: 12 .  Prenatal Vit w/Fe-Methylfol-FA (PNV PO), Take 1 tablet by mouth daily. , Disp: , Rfl:  No Known  Allergies  Social History   Tobacco Use  . Smoking status: Former Smoker    Types: Cigarettes  . Smokeless tobacco: Never Used  . Tobacco comment: prior to +UPT   Substance Use Topics  . Alcohol use: Never    Family History  Problem Relation Age of Onset  . Diabetes Mother   . Hypertension Mother       Review of Systems  Constitutional: negative for fatigue and weight loss Respiratory: negative for cough and wheezing Cardiovascular: negative for chest pain, fatigue and palpitations Gastrointestinal: negative for abdominal pain and change in bowel habits Musculoskeletal:negative for myalgias Neurological: negative for gait problems and tremors Behavioral/Psych: negative for abusive relationship, depression Endocrine: negative for temperature intolerance    Genitourinary:positive for abnormal menstrual periods, pelvic pain, vaginal discharge and  pain with urination.   Negative for genital lesions, hot flashes, sexual problems  Integument/breast: negative for breast lump, breast tenderness, nipple discharge and skin lesion(s)    Objective:       BP (!) 157/97   Pulse 90   Ht 5' 8.5" (1.74 m)   Wt 238 lb (108 kg)   LMP 07/07/2020   BMI 35.66 kg/m  General:   alert and no distress  Skin:   no rash or abnormalities  Lungs:   clear to auscultation bilaterally  Heart:   regular rate and rhythm, S1, S2 normal, no murmur, click, rub or gallop  Breasts:   normal without suspicious masses, skin or nipple changes or axillary nodes  Abdomen:  normal findings: no organomegaly, soft, non-tender and no hernia  Pelvis:  External genitalia: normal general appearance Urinary system: urethral meatus normal and bladder without fullness, nontender Vaginal: normal without tenderness, induration or masses Cervix: normal appearance Adnexa: normal bimanual exam Uterus: anteverted and non-tender, normal size   Lab Review Urine pregnancy test Labs reviewed yes Radiologic studies  reviewed yes  I have spent a total of 25 minutes of face-to-face and  time, excluding clinical staff time, reviewing notes and preparing to see patient, ordering tests and/or medications, and counseling the patient.   Assessment:     1. Encounter for routine gynecological examination with Papanicolaou smear of cervix Rx: - Cytology - PAP( )  2. Abnormal uterine bleeding (AUB) Rx: - US PELVIC COMPLETE WITH TRANSVAGINAL; Future  3. Pelvic pain Rx: - US PELVIC COMPLETE WITH TRANSVAGINAL; Future - ibuprofen (ADVIL) 800 MG tablet; Take 1 tablet (800 mg total) by mouth every 8 (eight) hours as needed.  Dispense: 30 tablet; Refill: 5  4. Vaginal discharge Rx: - Cervicovaginal ancillary only( [place])  5. Screening for STD (sexually transmitted disease) Rx: - HIV Antibody (routine testing w rflx) - Hepatitis B surface antigen -  RPR - Hepatitis C antibody  6. Dysuria Rx: - Urine Culture  7. Screening breast examination Rx: - MM Digital Screening; Future  8. HTN (hypertension), benign - managed by PCP  9. Diabetes 1.5, managed as type 2 ([place]) - managed by PCP    Plan:    Education reviewed: calcium supplements, depression evaluation, low fat, low cholesterol diet, safe sex/STD prevention, self breast exams and weight bearing exercise. Contraception: none. Mammogram ordered. Follow up in: 2 weeks.   Meds ordered this encounter  Medications  . ibuprofen (ADVIL) 800 MG tablet    Sig: Take 1 tablet (800 mg total) by mouth every 8 (eight) hours as needed.    Dispense:  30 tablet    Refill:  5   Orders Placed This Encounter  Procedures  . Urine Culture  . MM Digital Screening    Standing Status:   Future    Standing Expiration Date:   08/10/2021    Order Specific Question:   Reason for Exam (SYMPTOM  OR DIAGNOSIS REQUIRED)    Answer:   Screening    Order Specific Question:   Is the patient pregnant?    Answer:   No    Order Specific Question:    Preferred imaging location?    Answer:   [facility]  . US PELVIC COMPLETE WITH TRANSVAGINAL    Standing Status:   Future    Standing Expiration Date:   08/10/2021    Order Specific Question:   Reason for Exam (SYMPTOM  OR DIAGNOSIS REQUIRED)    Answer:   Pelvic pain and abdominal bloating    Order Specific Question:   Preferred imaging location?    Answer:   [facility]  . HIV Antibody (routine testing w rflx)  . Hepatitis B surface antigen  . RPR  . Hepatitis C antibody    Shelly Bombard, MD 08/10/2020

## 2020-08-11 LAB — CERVICOVAGINAL ANCILLARY ONLY
Bacterial Vaginitis (gardnerella): POSITIVE — AB
Candida Glabrata: NEGATIVE
Candida Vaginitis: NEGATIVE
Chlamydia: NEGATIVE
Comment: NEGATIVE
Comment: NEGATIVE
Comment: NEGATIVE
Comment: NEGATIVE
Comment: NEGATIVE
Comment: NORMAL
Neisseria Gonorrhea: NEGATIVE
Trichomonas: NEGATIVE

## 2020-08-11 LAB — HEPATITIS B SURFACE ANTIGEN: Hepatitis B Surface Ag: NEGATIVE

## 2020-08-11 LAB — HIV ANTIBODY (ROUTINE TESTING W REFLEX): HIV Screen 4th Generation wRfx: NONREACTIVE

## 2020-08-11 LAB — RPR: RPR Ser Ql: NONREACTIVE

## 2020-08-11 LAB — HEPATITIS C ANTIBODY: Hep C Virus Ab: 0.1 s/co ratio (ref 0.0–0.9)

## 2020-08-12 LAB — CYTOLOGY - PAP
Comment: NEGATIVE
Diagnosis: NEGATIVE
High risk HPV: NEGATIVE

## 2020-08-13 LAB — URINE CULTURE

## 2020-08-26 ENCOUNTER — Other Ambulatory Visit: Payer: Self-pay

## 2020-08-26 ENCOUNTER — Ambulatory Visit
Admission: RE | Admit: 2020-08-26 | Discharge: 2020-08-26 | Disposition: A | Discharge status: Home / Self Care | Payer: Medicaid Other | Source: Ambulatory Visit | Attending: Obstetrics | Admitting: Obstetrics

## 2020-08-26 DIAGNOSIS — N939 Abnormal uterine and vaginal bleeding, unspecified: Secondary | ICD-10-CM | POA: Diagnosis present

## 2020-08-26 DIAGNOSIS — R102 Pelvic and perineal pain: Secondary | ICD-10-CM | POA: Diagnosis not present

## 2020-08-26 DIAGNOSIS — R140 Abdominal distension (gaseous): Secondary | ICD-10-CM | POA: Diagnosis not present

## 2020-10-08 ENCOUNTER — Other Ambulatory Visit: Payer: Self-pay | Admitting: Obstetrics

## 2020-10-08 DIAGNOSIS — B9689 Other specified bacterial agents as the cause of diseases classified elsewhere: Secondary | ICD-10-CM

## 2020-10-08 MED ORDER — METRONIDAZOLE 500 MG PO TABS: 500.0000 mg | ORAL_TABLET | Freq: Two times a day (BID) | ORAL | 2 refills | Status: DC

## 2020-10-22 ENCOUNTER — Ambulatory Visit
Admission: RE | Admit: 2020-10-22 | Discharge: 2020-10-22 | Disposition: A | Discharge status: Home / Self Care | Payer: Medicaid Other | Source: Ambulatory Visit | Attending: Obstetrics | Admitting: Obstetrics

## 2020-10-22 ENCOUNTER — Other Ambulatory Visit: Payer: Self-pay

## 2020-10-22 DIAGNOSIS — Z1239 Encounter for other screening for malignant neoplasm of breast: Secondary | ICD-10-CM

## 2020-10-22 DIAGNOSIS — Z1231 Encounter for screening mammogram for malignant neoplasm of breast: Secondary | ICD-10-CM | POA: Diagnosis not present

## 2020-12-08 ENCOUNTER — Other Ambulatory Visit: Payer: Self-pay | Admitting: Internal Medicine

## 2020-12-08 DIAGNOSIS — Z794 Long term (current) use of insulin: Secondary | ICD-10-CM

## 2020-12-08 DIAGNOSIS — E119 Type 2 diabetes mellitus without complications: Secondary | ICD-10-CM

## 2020-12-09 NOTE — Telephone Encounter (Signed)
Called pt who stated she is a pt here at [facility]. I will ask Tyler Aas to assign a PCP.

## 2022-02-26 ENCOUNTER — Telehealth: Payer: Medicaid Other | Admitting: Family Medicine

## 2022-02-26 DIAGNOSIS — J40 Bronchitis, not specified as acute or chronic: Secondary | ICD-10-CM

## 2022-02-26 MED ORDER — CLARITHROMYCIN 500 MG PO TABS: 500.0000 mg | ORAL_TABLET | Freq: Two times a day (BID) | ORAL | 0 refills | Status: AC

## 2022-02-26 MED ORDER — BENZONATATE 200 MG PO CAPS: 200.0000 mg | ORAL_CAPSULE | Freq: Two times a day (BID) | ORAL | 0 refills | Status: DC | PRN

## 2022-02-26 NOTE — Progress Notes (Signed)
Virtual Visit Consent   [name], you are scheduled for a virtual visit with a [facility] provider today. Just as with appointments in the office, your consent must be obtained to participate. Your consent will be active for this visit and any virtual visit you may have with one of our providers in the next 365 days. If you have a MyChart account, a copy of this consent can be sent to you electronically.  As this is a virtual visit, video technology does not allow for your provider to perform a traditional examination. This may limit your provider's ability to fully assess your condition. If your provider identifies any concerns that need to be evaluated in person or the need to arrange testing (such as labs, EKG, etc.), we will make arrangements to do so. Although advances in technology are sophisticated, we cannot ensure that it will always work on either your end or our end. If the connection with a video visit is poor, the visit may have to be switched to a telephone visit. With either a video or telephone visit, we are not always able to ensure that we have a secure connection.  By engaging in this virtual visit, you consent to the provision of healthcare and authorize for your insurance to be billed (if applicable) for the services provided during this visit. Depending on your insurance coverage, you may receive a charge related to this service.  I need to obtain your verbal consent now. Are you willing to proceed with your visit today? Keilin Gamboa has provided verbal consent on 02/26/2022 for a virtual visit (video or telephone). Dellia Nims, FNP  Date: 02/26/2022 12:03 PM  Virtual Visit via Video Note   I, Dellia Nims, connected with  Miranda Barnett  ([id number], 1976/04/01) on 02/26/22 at 12:00 PM EDT by a video-enabled telemedicine application and verified that I am speaking with the correct person using two identifiers.  Location: Patient: Virtual Visit Location Patient:  Home Provider: Virtual Visit Location Provider: Home Office   I discussed the limitations of evaluation and management by telemedicine and the availability of in person appointments. The patient expressed understanding and agreed to proceed.    History of Present Illness: Miranda Barnett is a 46 y.o. who identifies as a female who was assigned female at birth, and is being seen today for cough, productive with green and yellow mucus for almost 3 weeks. No fever but has had chills. Head congestion but no facial pressure and pain. No wheezing or sob. Sx persistent. Marland Kitchen  HPI: HPI  Problems:  Patient Active Problem List   Diagnosis Date Noted   Type 2 diabetes mellitus without complication, with long-term current use of insulin ([place]) 07/02/2020   Irregular menstrual cycle 07/02/2020   Chronic hypertension 01/24/2019   History of 2 cesarean sections 11/21/2018    Allergies: No Known Allergies Medications:  Current Outpatient Medications:    benzonatate (TESSALON) 200 MG capsule, Take 1 capsule (200 mg total) by mouth 2 (two) times daily as needed for cough., Disp: 20 capsule, Rfl: 0   clarithromycin (BIAXIN) 500 MG tablet, Take 1 tablet (500 mg total) by mouth 2 (two) times daily for 10 days. Take with food and probiotics, Disp: 20 tablet, Rfl: 0   Accu-Chek FastClix Lancets MISC, USE 4 TIMES A DAY AS DIRECTED, Disp: 102 each, Rfl: 12   Blood Glucose Monitoring Suppl (ACCU-CHEK GUIDE) w/Device KIT, 1 Device by Does not apply route 4 (four) times daily., Disp: 1 kit, Rfl: 0  Blood Pressure Monitor KIT, 1 kit by Does not apply route once a week. CHECK BP WEEKLY.  LARGE CUFF.  DX:  Z13.6         Z34.86, Disp: 1 kit, Rfl: 0   empagliflozin (JARDIANCE) 10 MG TABS tablet, Take 1 tablet (10 mg total) by mouth daily before breakfast., Disp: 30 tablet, Rfl: 11   glucose blood (ACCU-CHEK GUIDE) test strip, USE TO CHECK BLOOD SUGARS FOUR TIMES A DAY *WAS INSTRUCTED*, Disp: 50 strip, Rfl: 12   HUMALOG 100  UNIT/ML injection, INJECT 30 UNITS INTO THE SKIN 3 (THREE) TIMES DAILY WITH MEALS, Disp: 30 mL, Rfl: 3   hydrochlorothiazide (HYDRODIURIL) 25 MG tablet, Take 1 tablet (25 mg total) by mouth daily., Disp: 30 tablet, Rfl: 11   ibuprofen (ADVIL) 800 MG tablet, Take 1 tablet (800 mg total) by mouth every 8 (eight) hours as needed., Disp: 30 tablet, Rfl: 5   insulin NPH Human (NOVOLIN N) 100 UNIT/ML injection, Inject 0.4 mLs (40 Units total) into the skin in the morning and at bedtime., Disp: 20 mL, Rfl: 3   Insulin Syringe-Needle U-100 31G X 15/64" 0.3 ML MISC, 40 Units by Does not apply route in the morning and at bedtime., Disp: 200 each, Rfl: 3   metFORMIN (GLUCOPHAGE) 500 MG tablet, Take 1 tablet (500 mg total) by mouth 2 (two) times daily with a meal. Increase to 1076m after 1 week as long as GI symptoms are tolerable, Disp: 60 tablet, Rfl: 11   metroNIDAZOLE (FLAGYL) 500 MG tablet, Take 1 tablet (500 mg total) by mouth 2 (two) times daily., Disp: 14 tablet, Rfl: 2   Prenatal Vit w/Fe-Methylfol-FA (PNV PO), Take 1 tablet by mouth daily. , Disp: , Rfl:   Observations/Objective: Patient is well-developed, well-nourished in no acute distress.  Resting comfortably  at home.  Head is normocephalic, atraumatic.  No labored breathing.  Speech is clear and coherent with logical content.  Patient is alert and oriented at baseline.    Assessment and Plan: 1. Bronchitis  Increase fluids, cool mist humidifier at night, take biaxin with probiotic and food. Urgent care if sx persist or worsen.   Follow Up Instructions: I discussed the assessment and treatment plan with the patient. The patient was provided an opportunity to ask questions and all were answered. The patient agreed with the plan and demonstrated an understanding of the instructions.  A copy of instructions were sent to the patient via MyChart unless otherwise noted below.   The patient was advised to call back or seek an in-person  evaluation if the symptoms worsen or if the condition fails to improve as anticipated.  Time:  I spent 10 minutes with the patient via telehealth technology discussing the above problems/concerns.    DDellia Nims FNP\

## 2023-03-19 ENCOUNTER — Encounter (HOSPITAL_BASED_OUTPATIENT_CLINIC_OR_DEPARTMENT_OTHER): Payer: Self-pay | Admitting: Emergency Medicine

## 2023-03-19 ENCOUNTER — Emergency Department (HOSPITAL_BASED_OUTPATIENT_CLINIC_OR_DEPARTMENT_OTHER)
Admission: EM | Admit: 2023-03-19 | Discharge: 2023-03-19 | Disposition: A | Discharge status: Home / Self Care | Payer: Medicaid Other | Attending: Emergency Medicine | Admitting: Emergency Medicine

## 2023-03-19 DIAGNOSIS — B349 Viral infection, unspecified: Secondary | ICD-10-CM | POA: Diagnosis not present

## 2023-03-19 DIAGNOSIS — I10 Essential (primary) hypertension: Secondary | ICD-10-CM | POA: Insufficient documentation

## 2023-03-19 DIAGNOSIS — Z794 Long term (current) use of insulin: Secondary | ICD-10-CM | POA: Insufficient documentation

## 2023-03-19 DIAGNOSIS — R059 Cough, unspecified: Secondary | ICD-10-CM | POA: Diagnosis present

## 2023-03-19 DIAGNOSIS — Z7984 Long term (current) use of oral hypoglycemic drugs: Secondary | ICD-10-CM | POA: Diagnosis not present

## 2023-03-19 DIAGNOSIS — Z1152 Encounter for screening for COVID-19: Secondary | ICD-10-CM | POA: Diagnosis not present

## 2023-03-19 DIAGNOSIS — E1165 Type 2 diabetes mellitus with hyperglycemia: Secondary | ICD-10-CM | POA: Diagnosis not present

## 2023-03-19 DIAGNOSIS — R739 Hyperglycemia, unspecified: Secondary | ICD-10-CM

## 2023-03-19 LAB — RESP PANEL BY RT-PCR (RSV, FLU A&B, COVID)  RVPGX2
Influenza A by PCR: NEGATIVE
Influenza B by PCR: NEGATIVE
Resp Syncytial Virus by PCR: NEGATIVE
SARS Coronavirus 2 by RT PCR: NEGATIVE

## 2023-03-19 LAB — CBG MONITORING, ED
Glucose-Capillary: 322 mg/dL — ABNORMAL HIGH (ref 70–99)
Glucose-Capillary: 310 mg/dL — ABNORMAL HIGH (ref 70–99)

## 2023-03-19 NOTE — ED Triage Notes (Signed)
Pt c/o cough and sore throat with generalized body aches x 6 days, denies fever

## 2023-03-19 NOTE — ED Provider Notes (Cosign Needed)
[place] EMERGENCY DEPARTMENT AT [facility] Provider Note   CSN: [id number] Arrival date & time: 03/19/23  0818   History  Chief Complaint  Patient presents with   Cough    Miranda Barnett is a 47 y.o. female. 47 year old female presenting with congestion, cough x 6 days with general body aches and sore throat.  She denies fever.  She presents with her daughter who is also been sick on and off for the past few weeks and additionally notes that her other child at home has been sick.   Cough      Home Medications Prior to Admission medications   Medication Sig Start Date End Date Taking? Authorizing Provider  Accu-Chek FastClix Lancets MISC USE 4 TIMES A DAY AS DIRECTED 03/17/20   Brock Bad, MD  benzonatate (TESSALON) 200 MG capsule Take 1 capsule (200 mg total) by mouth 2 (two) times daily as needed for cough. 02/26/22   Delorse Lek, FNP  Blood Glucose Monitoring Suppl (ACCU-CHEK GUIDE) w/Device KIT 1 Device by Does not apply route 4 (four) times daily. 01/11/19   Brock Bad, MD  Blood Pressure Monitor KIT 1 kit by Does not apply route once a week. CHECK BP WEEKLY.  LARGE CUFF.  DX:  Z13.6         Z34.86 11/09/18   Allie Bossier, MD  empagliflozin (JARDIANCE) 10 MG TABS tablet Take 1 tablet (10 mg total) by mouth daily before breakfast. 07/02/20   Theotis Barrio, MD  glucose blood (ACCU-CHEK GUIDE) test strip USE TO CHECK BLOOD SUGARS FOUR TIMES A DAY *WAS INSTRUCTED* 05/13/20   Brock Bad, MD  HUMALOG 100 UNIT/ML injection INJECT 30 UNITS INTO THE SKIN 3 (THREE) TIMES DAILY WITH MEALS 12/09/20   Inez Catalina, MD  hydrochlorothiazide (HYDRODIURIL) 25 MG tablet Take 1 tablet (25 mg total) by mouth daily. 07/02/20   Theotis Barrio, MD  ibuprofen (ADVIL) 800 MG tablet Take 1 tablet (800 mg total) by mouth every 8 (eight) hours as needed. 08/10/20   Brock Bad, MD  insulin NPH Human (NOVOLIN N) 100 UNIT/ML injection Inject 0.4 mLs (40 Units total)  into the skin in the morning and at bedtime. 07/02/20   Theotis Barrio, MD  Insulin Syringe-Needle U-100 31G X 15/64" 0.3 ML MISC 40 Units by Does not apply route in the morning and at bedtime. 07/02/20   Theotis Barrio, MD  metFORMIN (GLUCOPHAGE) 500 MG tablet Take 1 tablet (500 mg total) by mouth 2 (two) times daily with a meal. Increase to 1000mg  after 1 week as long as GI symptoms are tolerable 07/02/20 07/02/21  Theotis Barrio, MD  metroNIDAZOLE (FLAGYL) 500 MG tablet Take 1 tablet (500 mg total) by mouth 2 (two) times daily. 10/08/20   Brock Bad, MD  Prenatal Vit w/Fe-Methylfol-FA (PNV PO) Take 1 tablet by mouth daily.     [provider]      Allergies    Patient has no known allergies.    Review of Systems   Review of Systems  Respiratory:  Positive for cough.     Physical Exam Updated Vital Signs BP (!) 149/103   Pulse 85   Temp 98.2 F (36.8 C)   Resp 16   LMP 02/24/2023   SpO2 99%  Physical Exam Constitutional:      General: She is not in acute distress.    Appearance: She is not ill-appearing or toxic-appearing.  Cardiovascular:  Rate and Rhythm: Normal rate and regular rhythm.     Heart sounds: Normal heart sounds.  Pulmonary:     Effort: Pulmonary effort is normal.     Breath sounds: Normal breath sounds.  Abdominal:     General: Abdomen is flat. Bowel sounds are normal.     Palpations: Abdomen is soft.  Musculoskeletal:     Cervical back: Normal range of motion.    ED Results / Procedures / Treatments   Labs (all labs ordered are listed, but only abnormal results are displayed) Labs Reviewed  CBG MONITORING, ED - Abnormal; Notable for the following components:      Result Value   Glucose-Capillary 322 (*)    All other components within normal limits  CBG MONITORING, ED - Abnormal; Notable for the following components:   Glucose-Capillary 310 (*)    All other components within normal limits  RESP PANEL BY RT-PCR (RSV, FLU A&B, COVID)  [id number]    EKG None  Radiology No results found.  Procedures Procedures    Medications Ordered in ED Medications - No data to display  ED Course/ Medical Decision Making/ A&P                                 Medical Decision Making 47 year old female presenting with URI symptoms and sick household contacts with past medical history of HTN and T2DM.  Leading differential diagnosis is viral illness given clinical history.  Respiratory panel negative for COVID flu and RSV.  CBG is 310 likely related to lack of follow-up with PCP for her diabetes.  Stable for discharge.  Recommend conservative management for viral illness, following up with PCP for diabetic and hypertensive management.  Further recommended discussion with PCP regarding vaccinations.         Final Clinical Impression(s) / ED Diagnoses Final diagnoses:  Viral illness  Hyperglycemia   Rx / DC Orders ED Discharge Orders     None        Shelby Mattocks, DO 03/19/23 1022

## 2023-03-19 NOTE — ED Notes (Signed)

## 2023-03-19 NOTE — ED Provider Notes (Signed)
I saw and evaluated the patient, reviewed the resident's note and I agree with the findings and plan.      47 year old female presents with URI symptoms.  Patient nontoxic-appearing here.  Will check viral panel   Lorre Nick, MD 03/19/23 [phone]

## 2023-03-19 NOTE — Discharge Instructions (Addendum)
1.  Viral testing was negative for flu COVID and RSV.  I suspect you have a similar viral illness to your daughter.  You may continue use Tylenol, ibuprofen, cough drops or cough suppressant medications as needed.  This should resolve with time.  I do recommend having discussion of vaccinations with your primary care doctor. 2.  Your sugar was 310.  Please continue to take your insulin and diabetic medications as prescribed by your PCP. 3.  Hypertension: Your blood pressure was elevated.  Please follow-up with your primary care physician for adjustment in medications should that be necessary.

## 2023-04-19 ENCOUNTER — Telehealth: Payer: Medicaid Other | Admitting: Physician Assistant

## 2023-04-19 DIAGNOSIS — B9689 Other specified bacterial agents as the cause of diseases classified elsewhere: Secondary | ICD-10-CM

## 2023-04-19 DIAGNOSIS — J208 Acute bronchitis due to other specified organisms: Secondary | ICD-10-CM

## 2023-04-19 MED ORDER — IPRATROPIUM BROMIDE 0.03 % NA SOLN: 2.0000 | Freq: Two times a day (BID) | NASAL | 0 refills | Status: AC

## 2023-04-19 MED ORDER — BENZONATATE 100 MG PO CAPS: 100.0000 mg | ORAL_CAPSULE | Freq: Three times a day (TID) | ORAL | 0 refills | Status: DC | PRN

## 2023-04-19 MED ORDER — AZITHROMYCIN 250 MG PO TABS: ORAL_TABLET | ORAL | 0 refills | Status: AC

## 2023-04-19 NOTE — Progress Notes (Signed)
Virtual Visit Consent   Miranda Barnett, you are scheduled for a virtual visit with a [facility] provider today. Just as with appointments in the office, your consent must be obtained to participate. Your consent will be active for this visit and any virtual visit you may have with one of our providers in the next 365 days. If you have a MyChart account, a copy of this consent can be sent to you electronically.  As this is a virtual visit, video technology does not allow for your provider to perform a traditional examination. This may limit your provider's ability to fully assess your condition. If your provider identifies any concerns that need to be evaluated in person or the need to arrange testing (such as labs, EKG, etc.), we will make arrangements to do so. Although advances in technology are sophisticated, we cannot ensure that it will always work on either your end or our end. If the connection with a video visit is poor, the visit may have to be switched to a telephone visit. With either a video or telephone visit, we are not always able to ensure that we have a secure connection.  By engaging in this virtual visit, you consent to the provision of healthcare and authorize for your insurance to be billed (if applicable) for the services provided during this visit. Depending on your insurance coverage, you may receive a charge related to this service.  I need to obtain your verbal consent now. Are you willing to proceed with your visit today? Miranda Barnett has provided verbal consent on 04/19/2023 for a virtual visit (video or telephone). Miranda Barnett, [place]  Date: 04/19/2023 5:35 PM  Virtual Visit via Video Note   I, Miranda Barnett, connected with  Miranda Barnett  ([id number], July 15, 1975) on 04/19/23 at  5:30 PM EST by a video-enabled telemedicine application and verified that I am speaking with the correct person using two identifiers.  Location: Patient: Virtual Visit Location  Patient: Home Provider: Virtual Visit Location Provider: Home Office   I discussed the limitations of evaluation and management by telemedicine and the availability of in person appointments. The patient expressed understanding and agreed to proceed.    History of Present Illness: Miranda Barnett is a 47 y.o. who identifies as a female who was assigned female at birth, and is being seen today for URI symptoms starting about several weeks ago with nasal congestion and post-nasal drip. Was evaluated at ER on 11/17 with negative COVID/flu/RSV testing. Told was a viral illness so sent home with supportive measures. Notes over the following week there was some improvement without full resolution but worsening again over the past week or so. Patient notes increased nasal and head congestion, sinus pressure and worsening voice hoarseness. Now with change in phlegm from clear to thick and dark green, low-grade fever and sweats.  HPI: HPI  Problems:  Patient Active Problem List   Diagnosis Date Noted   Type 2 diabetes mellitus without complication, with long-term current use of insulin (HCC) 07/02/2020   Irregular menstrual cycle 07/02/2020   Chronic hypertension 01/24/2019   History of 2 cesarean sections 11/21/2018    Allergies: No Known Allergies Medications:  Current Outpatient Medications:    azithromycin (ZITHROMAX) 250 MG tablet, Take 2 tablets on day 1, then 1 tablet daily on days 2 through 5, Disp: 6 tablet, Rfl: 0   benzonatate (TESSALON) 100 MG capsule, Take 1 capsule (100 mg total) by mouth 3 (three) times daily as needed for  cough., Disp: 30 capsule, Rfl: 0   ipratropium (ATROVENT) 0.03 % nasal spray, Place 2 sprays into both nostrils every 12 (twelve) hours., Disp: 30 mL, Rfl: 0   Accu-Chek FastClix Lancets MISC, USE 4 TIMES A DAY AS DIRECTED, Disp: 102 each, Rfl: 12   Blood Glucose Monitoring Suppl (ACCU-CHEK GUIDE) w/Device KIT, 1 Device by Does not apply route 4 (four) times daily.,  Disp: 1 kit, Rfl: 0   Blood Pressure Monitor KIT, 1 kit by Does not apply route once a week. CHECK BP WEEKLY.  LARGE CUFF.  DX:  Z13.6         Z34.86, Disp: 1 kit, Rfl: 0   empagliflozin (JARDIANCE) 10 MG TABS tablet, Take 1 tablet (10 mg total) by mouth daily before breakfast., Disp: 30 tablet, Rfl: 11   glucose blood (ACCU-CHEK GUIDE) test strip, USE TO CHECK BLOOD SUGARS FOUR TIMES A DAY *WAS INSTRUCTED*, Disp: 50 strip, Rfl: 12   HUMALOG 100 UNIT/ML injection, INJECT 30 UNITS INTO THE SKIN 3 (THREE) TIMES DAILY WITH MEALS, Disp: 30 mL, Rfl: 3   hydrochlorothiazide (HYDRODIURIL) 25 MG tablet, Take 1 tablet (25 mg total) by mouth daily., Disp: 30 tablet, Rfl: 11   ibuprofen (ADVIL) 800 MG tablet, Take 1 tablet (800 mg total) by mouth every 8 (eight) hours as needed., Disp: 30 tablet, Rfl: 5   insulin NPH Human (NOVOLIN N) 100 UNIT/ML injection, Inject 0.4 mLs (40 Units total) into the skin in the morning and at bedtime., Disp: 20 mL, Rfl: 3   Insulin Syringe-Needle U-100 31G X 15/64" 0.3 ML MISC, 40 Units by Does not apply route in the morning and at bedtime., Disp: 200 each, Rfl: 3   metFORMIN (GLUCOPHAGE) 500 MG tablet, Take 1 tablet (500 mg total) by mouth 2 (two) times daily with a meal. Increase to 1000mg  after 1 week as long as GI symptoms are tolerable, Disp: 60 tablet, Rfl: 11  Observations/Objective: Patient is well-developed, well-nourished in no acute distress.  Resting comfortably at home.  Head is normocephalic, atraumatic.  No labored breathing.  Speech is clear and coherent with logical content.  Patient is alert and oriented at baseline.   Assessment and Plan: 1. Acute bacterial bronchitis (Primary) - benzonatate (TESSALON) 100 MG capsule; Take 1 capsule (100 mg total) by mouth 3 (three) times daily as needed for cough.  Dispense: 30 capsule; Refill: 0 - azithromycin (ZITHROMAX) 250 MG tablet; Take 2 tablets on day 1, then 1 tablet daily on days 2 through 5  Dispense: 6  tablet; Refill: 0 - ipratropium (ATROVENT) 0.03 % nasal spray; Place 2 sprays into both nostrils every 12 (twelve) hours.  Dispense: 30 mL; Refill: 0  Rx Azithromycin.  Increase fluids.  Rest.  Saline nasal spray.  Probiotic.  Mucinex as directed.  Humidifier in bedroom. Tessalon and Atrovent nasal spray.  Call or return to clinic if symptoms are not improving.   Follow Up Instructions: I discussed the assessment and treatment plan with the patient. The patient was provided an opportunity to ask questions and all were answered. The patient agreed with the plan and demonstrated an understanding of the instructions.  A copy of instructions were sent to the patient via MyChart unless otherwise noted below.   The patient was advised to call back or seek an in-person evaluation if the symptoms worsen or if the condition fails to improve as anticipated.    Miranda Climes, PA-C

## 2023-04-19 NOTE — Patient Instructions (Signed)
[name], thank you for joining Piedad Climes, PA-C for today's virtual visit.  While this provider is not your primary care provider (PCP), if your PCP is located in our provider database this encounter information will be shared with them immediately following your visit.   A [facility] MyChart account gives you access to today's visit and all your visits, tests, and labs performed at [facility] " click here if you don't have a [facility] MyChart account or go to [email or web address]  Consent: (Patient) Sindel Ley provided verbal consent for this virtual visit at the beginning of the encounter.  Current Medications:  Current Outpatient Medications:    Accu-Chek FastClix Lancets MISC, USE 4 TIMES A DAY AS DIRECTED, Disp: 102 each, Rfl: 12   benzonatate (TESSALON) 200 MG capsule, Take 1 capsule (200 mg total) by mouth 2 (two) times daily as needed for cough., Disp: 20 capsule, Rfl: 0   Blood Glucose Monitoring Suppl (ACCU-CHEK GUIDE) w/Device KIT, 1 Device by Does not apply route 4 (four) times daily., Disp: 1 kit, Rfl: 0   Blood Pressure Monitor KIT, 1 kit by Does not apply route once a week. CHECK BP WEEKLY.  LARGE CUFF.  DX:  Z13.6         Z34.86, Disp: 1 kit, Rfl: 0   empagliflozin (JARDIANCE) 10 MG TABS tablet, Take 1 tablet (10 mg total) by mouth daily before breakfast., Disp: 30 tablet, Rfl: 11   glucose blood (ACCU-CHEK GUIDE) test strip, USE TO CHECK BLOOD SUGARS FOUR TIMES A DAY *WAS INSTRUCTED*, Disp: 50 strip, Rfl: 12   HUMALOG 100 UNIT/ML injection, INJECT 30 UNITS INTO THE SKIN 3 (THREE) TIMES DAILY WITH MEALS, Disp: 30 mL, Rfl: 3   hydrochlorothiazide (HYDRODIURIL) 25 MG tablet, Take 1 tablet (25 mg total) by mouth daily., Disp: 30 tablet, Rfl: 11   ibuprofen (ADVIL) 800 MG tablet, Take 1 tablet (800 mg total) by mouth every 8 (eight) hours as needed., Disp: 30 tablet, Rfl: 5   insulin NPH Human (NOVOLIN N) 100 UNIT/ML injection, Inject 0.4 mLs  (40 Units total) into the skin in the morning and at bedtime., Disp: 20 mL, Rfl: 3   Insulin Syringe-Needle U-100 31G X 15/64" 0.3 ML MISC, 40 Units by Does not apply route in the morning and at bedtime., Disp: 200 each, Rfl: 3   metFORMIN (GLUCOPHAGE) 500 MG tablet, Take 1 tablet (500 mg total) by mouth 2 (two) times daily with a meal. Increase to 1000mg  after 1 week as long as GI symptoms are tolerable, Disp: 60 tablet, Rfl: 11   metroNIDAZOLE (FLAGYL) 500 MG tablet, Take 1 tablet (500 mg total) by mouth 2 (two) times daily., Disp: 14 tablet, Rfl: 2   Prenatal Vit w/Fe-Methylfol-FA (PNV PO), Take 1 tablet by mouth daily. , Disp: , Rfl:    Medications ordered in this encounter:  No orders of the defined types were placed in this encounter.    *If you need refills on other medications prior to your next appointment, please contact your pharmacy*  Follow-Up: Call back or seek an in-person evaluation if the symptoms worsen or if the condition fails to improve as anticipated.  [facility] [phone]  Other Instructions Take antibiotic (Azithromycin) as directed.  Increase fluids.  Get plenty of rest. Use Mucinex for congestion. Use the Tessalon and Atrovent as directed. Take a daily probiotic (I recommend Align or Culturelle, but even Activia Yogurt may be beneficial).  A humidifier placed in the  bedroom may offer some relief for a dry, scratchy throat of nasal irritation.  Read information below on acute bronchitis. Please call or return to clinic if symptoms are not improving.  Acute Bronchitis Bronchitis is when the airways that extend from the windpipe into the lungs get red, puffy, and painful (inflamed). Bronchitis often causes thick spit (mucus) to develop. This leads to a cough. A cough is the most common symptom of bronchitis. In acute bronchitis, the condition usually begins suddenly and goes away over time (usually in 2 weeks). Smoking, allergies, and asthma can make  bronchitis worse. Repeated episodes of bronchitis may cause more lung problems.  HOME CARE Rest. Drink enough fluids to keep your pee (urine) clear or pale yellow (unless you need to limit fluids as told by your doctor). Only take over-the-counter or prescription medicines as told by your doctor. Avoid smoking and secondhand smoke. These can make bronchitis worse. If you are a smoker, think about using nicotine gum or skin patches. Quitting smoking will help your lungs heal faster. Reduce the chance of getting bronchitis again by: Washing your hands often. Avoiding people with cold symptoms. Trying not to touch your hands to your mouth, nose, or eyes. Follow up with your doctor as told.  GET HELP IF: Your symptoms do not improve after 1 week of treatment. Symptoms include: Cough. Fever. Coughing up thick spit. Body aches. Chest congestion. Chills. Shortness of breath. Sore throat.  GET HELP RIGHT AWAY IF:  You have an increased fever. You have chills. You have severe shortness of breath. You have bloody thick spit (sputum). You throw up (vomit) often. You lose too much body fluid (dehydration). You have a severe headache. You faint.  MAKE SURE YOU:  Understand these instructions. Will watch your condition. Will get help right away if you are not doing well or get worse. Document Released: 10/05/2007 Document Revised: 12/19/2012 Document Reviewed: 10/09/2012 [facility] Patient Information 2015 [place], [place]. This information is not intended to replace advice given to you by your health care provider. Make sure you discuss any questions you have with your health care provider.    If you have been instructed to have an in-person evaluation today at a local Urgent Care facility, please use the link below. It will take you to a list of all of our available [facility], including address, phone number and hours of operation. Please do not delay care.  [facility]  [facility]  If you or a family member do not have a primary care provider, use the link below to schedule a visit and establish care. When you choose a [facility] primary care physician or advanced practice provider, you gain a long-term partner in health. Find a Primary Care Provider  Learn more about [facility]'s in-office and virtual care options: [facility] - Get Care Now

## 2023-05-31 ENCOUNTER — Telehealth: Payer: Medicaid Other | Admitting: Physician Assistant

## 2023-05-31 DIAGNOSIS — J069 Acute upper respiratory infection, unspecified: Secondary | ICD-10-CM

## 2023-05-31 MED ORDER — FLUTICASONE PROPIONATE 50 MCG/ACT NA SUSP: 2.0000 | Freq: Every day | NASAL | 0 refills | Status: AC

## 2023-05-31 MED ORDER — PSEUDOEPH-BROMPHEN-DM 30-2-10 MG/5ML PO SYRP: 5.0000 mL | ORAL_SOLUTION | Freq: Four times a day (QID) | ORAL | 0 refills | Status: DC | PRN

## 2023-05-31 MED ORDER — ONDANSETRON 4 MG PO TBDP: 4.0000 mg | ORAL_TABLET | Freq: Three times a day (TID) | ORAL | 0 refills | Status: DC | PRN

## 2023-05-31 NOTE — Patient Instructions (Signed)
Miranda Barnett, thank you for joining Miranda Loveless, PA-C for today's virtual visit.  While this provider is not your primary care provider (PCP), if your PCP is located in our provider database this encounter information will be shared with them immediately following your visit.   A [facility] MyChart account gives you access to today's visit and all your visits, tests, and labs performed at [facility] " click here if you don't have a  MyChart account or go to [email or web address]  Consent: (Patient) Miranda Barnett provided verbal consent for this virtual visit at the beginning of the encounter.  Current Medications:  Current Outpatient Medications:    brompheniramine-pseudoephedrine-DM 30-2-10 MG/5ML syrup, Take 5 mLs by mouth 4 (four) times daily as needed., Disp: 120 mL, Rfl: 0   fluticasone (FLONASE) 50 MCG/ACT nasal spray, Place 2 sprays into both nostrils daily., Disp: 16 g, Rfl: 0   ondansetron (ZOFRAN-ODT) 4 MG disintegrating tablet, Take 1 tablet (4 mg total) by mouth every 8 (eight) hours as needed., Disp: 20 tablet, Rfl: 0   Accu-Chek FastClix Lancets MISC, USE 4 TIMES A DAY AS DIRECTED, Disp: 102 each, Rfl: 12   benzonatate (TESSALON) 100 MG capsule, Take 1 capsule (100 mg total) by mouth 3 (three) times daily as needed for cough., Disp: 30 capsule, Rfl: 0   Blood Glucose Monitoring Suppl (ACCU-CHEK GUIDE) w/Device KIT, 1 Device by Does not apply route 4 (four) times daily., Disp: 1 kit, Rfl: 0   Blood Pressure Monitor KIT, 1 kit by Does not apply route once a week. CHECK BP WEEKLY.  LARGE CUFF.  DX:  Z13.6         Z34.86, Disp: 1 kit, Rfl: 0   empagliflozin (JARDIANCE) 10 MG TABS tablet, Take 1 tablet (10 mg total) by mouth daily before breakfast., Disp: 30 tablet, Rfl: 11   glucose blood (ACCU-CHEK GUIDE) test strip, USE TO CHECK BLOOD SUGARS FOUR TIMES A DAY *WAS INSTRUCTED*, Disp: 50 strip, Rfl: 12   HUMALOG 100 UNIT/ML injection, INJECT 30  UNITS INTO THE SKIN 3 (THREE) TIMES DAILY WITH MEALS, Disp: 30 mL, Rfl: 3   hydrochlorothiazide (HYDRODIURIL) 25 MG tablet, Take 1 tablet (25 mg total) by mouth daily., Disp: 30 tablet, Rfl: 11   ibuprofen (ADVIL) 800 MG tablet, Take 1 tablet (800 mg total) by mouth every 8 (eight) hours as needed., Disp: 30 tablet, Rfl: 5   insulin NPH Human (NOVOLIN N) 100 UNIT/ML injection, Inject 0.4 mLs (40 Units total) into the skin in the morning and at bedtime., Disp: 20 mL, Rfl: 3   Insulin Syringe-Needle U-100 31G X 15/64" 0.3 ML MISC, 40 Units by Does not apply route in the morning and at bedtime., Disp: 200 each, Rfl: 3   ipratropium (ATROVENT) 0.03 % nasal spray, Place 2 sprays into both nostrils every 12 (twelve) hours., Disp: 30 mL, Rfl: 0   metFORMIN (GLUCOPHAGE) 500 MG tablet, Take 1 tablet (500 mg total) by mouth 2 (two) times daily with a meal. Increase to 1000mg  after 1 week as long as GI symptoms are tolerable, Disp: 60 tablet, Rfl: 11   Medications ordered in this encounter:  Meds ordered this encounter  Medications   brompheniramine-pseudoephedrine-DM 30-2-10 MG/5ML syrup    Sig: Take 5 mLs by mouth 4 (four) times daily as needed.    Dispense:  120 mL    Refill:  0    Supervising Provider:   Merrilee Jansky [[id number]]   fluticasone (FLONASE) 50 MCG/ACT nasal  spray    Sig: Place 2 sprays into both nostrils daily.    Dispense:  16 g    Refill:  0    Supervising Provider:   LAMPTEY, PHILIP O [[id number]]   ondansetron (ZOFRAN-ODT) 4 MG disintegrating tablet    Sig: Take 1 tablet (4 mg total) by mouth every 8 (eight) hours as needed.    Dispense:  20 tablet    Refill:  0    Supervising Provider:   Merrilee Jansky [[id number]]     *If you need refills on other medications prior to your next appointment, please contact your pharmacy*  Follow-Up: Call back or seek an in-person evaluation if the symptoms worsen or if the condition fails to improve as anticipated.  [facility]  [phone]  Other Instructions Viral Respiratory Infection A respiratory infection is an illness that affects part of the respiratory system, such as the lungs, nose, or throat. A respiratory infection that is caused by a virus is called a viral respiratory infection. Common types of viral respiratory infections include: A cold. The flu (influenza). A respiratory syncytial virus (RSV) infection. What are the causes? This condition is caused by a virus. The virus may spread through contact with droplets or direct contact with infected people or their mucus or secretions. The virus may spread from person to person (is contagious). What are the signs or symptoms? Symptoms of this condition include: A stuffy or runny nose. A sore throat or cough. Shortness of breath or difficulty breathing. Yellow or green mucus (sputum). Other symptoms may include: A fever. Sweating or chills. Fatigue. Achy muscles. A headache. How is this diagnosed? This condition may be diagnosed based on: Your symptoms. A physical exam. Testing of secretions from the nose or throat. Chest X-ray. How is this treated? This condition may be treated with medicines, such as: Antiviral medicine. This may shorten the length of time a person has symptoms. Expectorants. These make it easier to cough up mucus. Decongestant nasal sprays. Acetaminophen or NSAIDs, such as ibuprofen, to relieve fever and pain. Antibiotic medicines are not prescribed for viral infections.This is because antibiotics are designed to kill bacteria. They do not kill viruses. Follow these instructions at home: Managing pain and congestion Take over-the-counter and prescription medicines only as told by your health care provider. If you have a sore throat, gargle with a mixture of salt and water 3-4 times a day or as needed. To make salt water, completely dissolve -1 tsp (3-6 g) of salt in 1 cup (237 mL) of warm water. Use nose drops made  from salt water to ease congestion and soften raw skin around your nose. Take 2 tsp (10 mL) of honey at bedtime to lessen coughing at night. Do not give honey to children who are younger than 1 year. Drink enough fluid to keep your urine pale yellow. This helps prevent dehydration and helps loosen up mucus. General instructions  Rest as much as possible. Do not drink alcohol. Do not use any products that contain nicotine or tobacco. These products include cigarettes, chewing tobacco, and vaping devices, such as e-cigarettes. If you need help quitting, ask your health care provider. Keep all follow-up visits. This is important. How is this prevented?     Get an annual flu shot. You may get the flu shot in late summer, fall, or winter. Ask your health care provider when you should get your flu shot. Avoid spreading your infection to other people. If you are  sick: Wash your hands with soap and water often, especially after you cough or sneeze. Wash for at least 20 seconds. If soap and water are not available, use alcohol-based hand sanitizer. Cover your mouth when you cough. Cover your nose and mouth when you sneeze. Do not share cups or eating utensils. Clean commonly used objects often. Clean commonly touched surfaces. Stay home from work or school as told by your health care provider. Avoid contact with people who are sick during cold and flu season. This is generally fall and winter. Contact a health care provider if: Your symptoms last for 10 days or longer. Your symptoms get worse over time. You have severe sinus pain in your face or forehead. The glands in your jaw or neck become very swollen. You have shortness of breath. Get help right away if you: Feel pain or pressure in your chest. Have trouble breathing. Faint or feel like you will faint. Have severe and persistent vomiting. Feel confused or disoriented. These symptoms may represent a serious problem that is an emergency.  Do not wait to see if the symptoms will go away. Get medical help right away. Call your local emergency services (911 in the [place] Do not drive yourself to the hospital. Summary A respiratory infection is an illness that affects part of the respiratory system, such as the lungs, nose, or throat. A respiratory infection that is caused by a virus is called a viral respiratory infection. Common types of viral respiratory infections include a cold, influenza, and respiratory syncytial virus (RSV) infection. Symptoms of this condition include a stuffy or runny nose, cough, fatigue, achy muscles, sore throat, and fevers or chills. Antibiotic medicines are not prescribed for viral infections. This is because antibiotics are designed to kill bacteria. They are not effective against viruses. This information is not intended to replace advice given to you by your health care provider. Make sure you discuss any questions you have with your health care provider. Document Revised: 07/23/2020 Document Reviewed: 07/23/2020 Elsevier Patient Education  2024 [employer]   If you have been instructed to have an in-person evaluation today at a local Urgent Care facility, please use the link below. It will take you to a list of all of our available [facility], including address, phone number and hours of operation. Please do not delay care.  [facility]  If you or a family member do not have a primary care provider, use the link below to schedule a visit and establish care. When you choose a [facility] primary care physician or advanced practice provider, you gain a long-term partner in health. Find a Primary Care Provider  Learn more about [facility]'s in-office and virtual care options: [facility] - Get Care Now

## 2023-05-31 NOTE — Progress Notes (Signed)
Virtual Visit Consent   Miranda Barnett, you are scheduled for a virtual visit with a [facility] provider today. Just as with appointments in the office, your consent must be obtained to participate. Your consent will be active for this visit and any virtual visit you may have with one of our providers in the next 365 days. If you have a MyChart account, a copy of this consent can be sent to you electronically.  As this is a virtual visit, video technology does not allow for your provider to perform a traditional examination. This may limit your provider's ability to fully assess your condition. If your provider identifies any concerns that need to be evaluated in person or the need to arrange testing (such as labs, EKG, etc.), we will make arrangements to do so. Although advances in technology are sophisticated, we cannot ensure that it will always work on either your end or our end. If the connection with a video visit is poor, the visit may have to be switched to a telephone visit. With either a video or telephone visit, we are not always able to ensure that we have a secure connection.  By engaging in this virtual visit, you consent to the provision of healthcare and authorize for your insurance to be billed (if applicable) for the services provided during this visit. Depending on your insurance coverage, you may receive a charge related to this service.  I need to obtain your verbal consent now. Are you willing to proceed with your visit today? Miranda Barnett has provided verbal consent on 05/31/2023 for a virtual visit (video or telephone). [name], PA-C  Date: 05/31/2023 8:58 AM  Virtual Visit via Video Note   I, [name], connected with  Miranda Barnett  ([id number], 08-Feb-1976) on 05/31/23 at  8:45 AM EST by a video-enabled telemedicine application and verified that I am speaking with the correct person using two identifiers.  Location: Patient: Virtual Visit Location  Patient: Home Provider: Virtual Visit Location Provider: Home Office   I discussed the limitations of evaluation and management by telemedicine and the availability of in person appointments. The patient expressed understanding and agreed to proceed.    History of Present Illness: Miranda Barnett is a 48 y.o. who identifies as a female who was assigned female at birth, and is being seen today for URI symptoms.  HPI: URI  This is a new problem. The current episode started in the past 7 days (Started over the weekend). The problem has been gradually worsening. Maximum temperature: having sweats, no documented temperature. Associated symptoms include chest pain (with cough), congestion, coughing (productive; worsens with lying down), diarrhea (first day), headaches, a plugged ear sensation (left only when lying on left), rhinorrhea and sinus pain. Pertinent negatives include no ear pain, nausea, sore throat, vomiting or wheezing. Associated symptoms comments: Myalgias, no appetite. Treatments tried: dayquil, nyquil. The treatment provided no relief.  Had sick contacts with children last week  Drinking bone broth   Problems:  Patient Active Problem List   Diagnosis Date Noted   Type 2 diabetes mellitus without complication, with long-term current use of insulin (HCC) 07/02/2020   Irregular menstrual cycle 07/02/2020   Chronic hypertension 01/24/2019   History of 2 cesarean sections 11/21/2018    Allergies: No Known Allergies Medications:  Current Outpatient Medications:    brompheniramine-pseudoephedrine-DM 30-2-10 MG/5ML syrup, Take 5 mLs by mouth 4 (four) times daily as needed., Disp: 120 mL, Rfl: 0   fluticasone (FLONASE) 50  MCG/ACT nasal spray, Place 2 sprays into both nostrils daily., Disp: 16 g, Rfl: 0   ondansetron (ZOFRAN-ODT) 4 MG disintegrating tablet, Take 1 tablet (4 mg total) by mouth every 8 (eight) hours as needed., Disp: 20 tablet, Rfl: 0   Accu-Chek FastClix Lancets MISC, USE 4  TIMES A DAY AS DIRECTED, Disp: 102 each, Rfl: 12   benzonatate (TESSALON) 100 MG capsule, Take 1 capsule (100 mg total) by mouth 3 (three) times daily as needed for cough., Disp: 30 capsule, Rfl: 0   Blood Glucose Monitoring Suppl (ACCU-CHEK GUIDE) w/Device KIT, 1 Device by Does not apply route 4 (four) times daily., Disp: 1 kit, Rfl: 0   Blood Pressure Monitor KIT, 1 kit by Does not apply route once a week. CHECK BP WEEKLY.  LARGE CUFF.  DX:  Z13.6         Z34.86, Disp: 1 kit, Rfl: 0   empagliflozin (JARDIANCE) 10 MG TABS tablet, Take 1 tablet (10 mg total) by mouth daily before breakfast., Disp: 30 tablet, Rfl: 11   glucose blood (ACCU-CHEK GUIDE) test strip, USE TO CHECK BLOOD SUGARS FOUR TIMES A DAY *WAS INSTRUCTED*, Disp: 50 strip, Rfl: 12   HUMALOG 100 UNIT/ML injection, INJECT 30 UNITS INTO THE SKIN 3 (THREE) TIMES DAILY WITH MEALS, Disp: 30 mL, Rfl: 3   hydrochlorothiazide (HYDRODIURIL) 25 MG tablet, Take 1 tablet (25 mg total) by mouth daily., Disp: 30 tablet, Rfl: 11   ibuprofen (ADVIL) 800 MG tablet, Take 1 tablet (800 mg total) by mouth every 8 (eight) hours as needed., Disp: 30 tablet, Rfl: 5   insulin NPH Human (NOVOLIN N) 100 UNIT/ML injection, Inject 0.4 mLs (40 Units total) into the skin in the morning and at bedtime., Disp: 20 mL, Rfl: 3   Insulin Syringe-Needle U-100 31G X 15/64" 0.3 ML MISC, 40 Units by Does not apply route in the morning and at bedtime., Disp: 200 each, Rfl: 3   ipratropium (ATROVENT) 0.03 % nasal spray, Place 2 sprays into both nostrils every 12 (twelve) hours., Disp: 30 mL, Rfl: 0   metFORMIN (GLUCOPHAGE) 500 MG tablet, Take 1 tablet (500 mg total) by mouth 2 (two) times daily with a meal. Increase to 1000mg  after 1 week as long as GI symptoms are tolerable, Disp: 60 tablet, Rfl: 11  Observations/Objective: Patient is well-developed, well-nourished in no acute distress.  Resting comfortably at home.  Head is normocephalic, atraumatic.  No labored breathing.   Speech is clear and coherent with logical content.  Patient is alert and oriented at baseline.    Assessment and Plan: 1. Viral upper respiratory infection (Primary) - brompheniramine-pseudoephedrine-DM 30-2-10 MG/5ML syrup; Take 5 mLs by mouth 4 (four) times daily as needed.  Dispense: 120 mL; Refill: 0 - fluticasone (FLONASE) 50 MCG/ACT nasal spray; Place 2 sprays into both nostrils daily.  Dispense: 16 g; Refill: 0 - ondansetron (ZOFRAN-ODT) 4 MG disintegrating tablet; Take 1 tablet (4 mg total) by mouth every 8 (eight) hours as needed.  Dispense: 20 tablet; Refill: 0  - Suspect viral URI - Symptomatic medications of choice over the counter as needed - Bromfed DM for cough - Flonase for nasal congestion and drainage - Zofran for any nausea - Push fluids - Rest - Seek further evaluation if symptoms change or worsen   Follow Up Instructions: I discussed the assessment and treatment plan with the patient. The patient was provided an opportunity to ask questions and all were answered. The patient agreed with the plan and demonstrated  an understanding of the instructions.  A copy of instructions were sent to the patient via MyChart unless otherwise noted below.    The patient was advised to call back or seek an in-person evaluation if the symptoms worsen or if the condition fails to improve as anticipated.    [name], PA-C

## 2023-06-12 ENCOUNTER — Ambulatory Visit: Payer: Medicaid Other | Admitting: Nurse Practitioner

## 2023-06-12 ENCOUNTER — Encounter: Payer: Self-pay | Admitting: Nurse Practitioner

## 2023-06-12 VITALS — BP 140/78 | HR 85 | Temp 97.7°F | Ht 68.0 in | Wt 211.8 lb

## 2023-06-12 DIAGNOSIS — Z7689 Persons encountering health services in other specified circumstances: Secondary | ICD-10-CM

## 2023-06-12 DIAGNOSIS — Z1211 Encounter for screening for malignant neoplasm of colon: Secondary | ICD-10-CM

## 2023-06-12 DIAGNOSIS — E66811 Obesity, class 1: Secondary | ICD-10-CM

## 2023-06-12 DIAGNOSIS — N920 Excessive and frequent menstruation with regular cycle: Secondary | ICD-10-CM | POA: Diagnosis not present

## 2023-06-12 DIAGNOSIS — Z794 Long term (current) use of insulin: Secondary | ICD-10-CM | POA: Diagnosis not present

## 2023-06-12 DIAGNOSIS — E6609 Other obesity due to excess calories: Secondary | ICD-10-CM

## 2023-06-12 DIAGNOSIS — Z1231 Encounter for screening mammogram for malignant neoplasm of breast: Secondary | ICD-10-CM

## 2023-06-12 DIAGNOSIS — E669 Obesity, unspecified: Secondary | ICD-10-CM

## 2023-06-12 DIAGNOSIS — E1169 Type 2 diabetes mellitus with other specified complication: Secondary | ICD-10-CM | POA: Diagnosis not present

## 2023-06-12 DIAGNOSIS — Z6832 Body mass index (BMI) 32.0-32.9, adult: Secondary | ICD-10-CM | POA: Diagnosis not present

## 2023-06-12 DIAGNOSIS — I10 Essential (primary) hypertension: Secondary | ICD-10-CM

## 2023-06-12 DIAGNOSIS — E119 Type 2 diabetes mellitus without complications: Secondary | ICD-10-CM | POA: Diagnosis not present

## 2023-06-12 DIAGNOSIS — F331 Major depressive disorder, recurrent, moderate: Secondary | ICD-10-CM | POA: Diagnosis not present

## 2023-06-12 DIAGNOSIS — Z79899 Other long term (current) drug therapy: Secondary | ICD-10-CM | POA: Diagnosis not present

## 2023-06-12 DIAGNOSIS — Z2821 Immunization not carried out because of patient refusal: Secondary | ICD-10-CM

## 2023-06-12 MED ORDER — LOSARTAN POTASSIUM 25 MG PO TABS: 25.0000 mg | ORAL_TABLET | Freq: Every day | ORAL | 1 refills | Status: DC

## 2023-06-12 MED ORDER — MAGNESIUM GLYCINATE 100 MG PO CAPS: 2.0000 | ORAL_CAPSULE | Freq: Every day | ORAL | 3 refills | Status: AC

## 2023-06-12 MED ORDER — BLOOD GLUCOSE MONITOR KIT: PACK | 0 refills | Status: DC

## 2023-06-12 MED ORDER — EMPAGLIFLOZIN 10 MG PO TABS: 10.0000 mg | ORAL_TABLET | Freq: Every day | ORAL | 1 refills | Status: DC

## 2023-06-12 NOTE — Progress Notes (Signed)
 Madelaine Bhat, CMA,acting as a Neurosurgeon for Arnette Felts, FNP.,have documented all relevant documentation on the behalf of Arnette Felts, FNP,as directed by  Arnette Felts, FNP while in the presence of Arnette Felts, FNP.  Subjective:  Patient ID: Miranda Barnett , female    DOB: 1975/09/27 , 48 y.o.   MRN: [id number]  Chief Complaint  Patient presents with   Establish Care    HPI  Patient presents today to establish care. Has not had a PCP.  She is working part time with [employer] services. She has a fiance - live in same home. Children - 3 she has a 66 y/o daughter, 78 y/o daughter and 19 y/o daughter.   She would would like to discuss today her BP and DM issues. Patient admits she is out of insulin and blood pressure medication. Patient reports she has been out of both medications since last year. Patient reports she was last seen by a PCP about 2 years ago. Patient reports she has had a cough for the past 2 weeks. She stopped taking metformin due to having restless leg and body aches. She has taken glipizide.   She was initially diagnosed about 4 years ago with diabetes. She had gestational diabetes with her second child. Patient reports she has very long menstrual cycles- patient reports it is always on and off for about 2 weeks. She does not have a GYN - she was seeing Dr Clearance Coots last seen in 2022. She had a bilateral salpingectomy. She has more low back pain with her cycle during her menstruation. She was seen by Dr. Clearance Coots after this and everything was normal. Her menstrual cycle is not thinning with the second week. She will now have an order.   She had a thought of suicide in the past once a teenager and recently. No plan. Has not seen a therapist in the past.      Past Medical History:  Diagnosis Date   Diabetes mellitus without complication (HCC)    Hypertension      Family History  Problem Relation Age of Onset   Diabetes Mother    Hypertension Mother      Current Outpatient  Medications:    Accu-Chek FastClix Lancets MISC, USE 4 TIMES A DAY AS DIRECTED, Disp: 102 each, Rfl: 12   benzonatate (TESSALON) 100 MG capsule, Take 1 capsule (100 mg total) by mouth 3 (three) times daily as needed for cough., Disp: 30 capsule, Rfl: 0   blood glucose meter kit and supplies KIT, Dispense based on patient and insurance preference. Use up to four times daily as directed., Disp: 1 each, Rfl: 0   Blood Glucose Monitoring Suppl (ACCU-CHEK GUIDE) w/Device KIT, 1 Device by Does not apply route 4 (four) times daily., Disp: 1 kit, Rfl: 0   brompheniramine-pseudoephedrine-DM 30-2-10 MG/5ML syrup, Take 5 mLs by mouth 4 (four) times daily as needed., Disp: 120 mL, Rfl: 0   fluticasone (FLONASE) 50 MCG/ACT nasal spray, Place 2 sprays into both nostrils daily., Disp: 16 g, Rfl: 0   glucose blood (ACCU-CHEK GUIDE) test strip, USE TO CHECK BLOOD SUGARS FOUR TIMES A DAY *WAS INSTRUCTED*, Disp: 50 strip, Rfl: 12   ipratropium (ATROVENT) 0.03 % nasal spray, Place 2 sprays into both nostrils every 12 (twelve) hours., Disp: 30 mL, Rfl: 0   losartan (COZAAR) 25 MG tablet, Take 1 tablet (25 mg total) by mouth daily., Disp: 90 tablet, Rfl: 1   Magnesium Glycinate 100 MG CAPS, Take 2 capsules  by mouth daily., Disp: 60 capsule, Rfl: 3   Blood Pressure Monitor KIT, 1 kit by Does not apply route once a week. CHECK BP WEEKLY.  LARGE CUFF.  DX:  Z13.6         Z34.86 (Patient not taking: Reported on 06/12/2023), Disp: 1 kit, Rfl: 0   Continuous Glucose Sensor (DEXCOM G7 SENSOR) MISC, USE DEXCOM TO CHECK BLOOD SUGARS 3 TIMES DAILY., Disp: 3 each, Rfl: 2   empagliflozin (JARDIANCE) 10 MG TABS tablet, Take 1 tablet (10 mg total) by mouth daily before breakfast., Disp: 90 tablet, Rfl: 1   hydrochlorothiazide (HYDRODIURIL) 25 MG tablet, Take 1 tablet (25 mg total) by mouth daily. (Patient not taking: Reported on 06/12/2023), Disp: 30 tablet, Rfl: 11   insulin degludec (TRESIBA FLEXTOUCH) 100 UNIT/ML FlexTouch Pen, Inject  10 Units into the skin daily., Disp: 15 mL, Rfl: 1   insulin lispro (HUMALOG KWIKPEN) 100 UNIT/ML KwikPen, Inject [place] insulin before meals and at hs if BS < 150 - 0 units, 150-199 - 3 units, 200-249 - 6 units, 250-299 - 9 units, 300-349 - 12 units, >350 - 15 units. If >400 call our office with your blood sugar readings., Disp: 15 mL, Rfl: 3   ondansetron (ZOFRAN-ODT) 4 MG disintegrating tablet, Take 1 tablet (4 mg total) by mouth every 8 (eight) hours as needed. (Patient not taking: Reported on 06/12/2023), Disp: 20 tablet, Rfl: 0   Vitamin D, Ergocalciferol, (DRISDOL) 1.25 MG (50000 UNIT) CAPS capsule, Take 1 capsule (50,000 Units total) by mouth every 7 (seven) days., Disp: 12 capsule, Rfl: 1   No Known Allergies   Review of Systems  Constitutional: Negative.  Negative for activity change and fatigue.  Eyes:  Negative for visual disturbance.  Respiratory: Negative.  Negative for choking, shortness of breath and wheezing.   Cardiovascular: Negative.  Negative for chest pain, palpitations and leg swelling.  Gastrointestinal: Negative.   Endocrine: Negative.  Negative for polydipsia, polyphagia and polyuria.  Musculoskeletal: Negative.   Skin: Negative.   Neurological:  Negative for dizziness, weakness and headaches.  Psychiatric/Behavioral:  Positive for sleep disturbance. Negative for confusion. The patient is not nervous/anxious.      Today's Vitals   06/12/23 1111  BP: (!) 140/78  Pulse: 85  Temp: 97.7 F (36.5 C)  TempSrc: Oral  Weight: 211 lb 12.8 oz (96.1 kg)  Height: 5\' 8"  (1.727 m)  PainSc: 0-No pain   Body mass index is 32.2 kg/m.  Wt Readings from Last 3 Encounters:  06/12/23 211 lb 12.8 oz (96.1 kg)  08/10/20 238 lb (108 kg)  07/02/20 233 lb 3.2 oz (105.8 kg)    Objective:  Physical Exam Vitals reviewed.  Constitutional:      General: She is not in acute distress.    Appearance: Normal appearance. She is well-developed. She is obese.  Cardiovascular:     Rate and  Rhythm: Normal rate and regular rhythm.     Pulses: Normal pulses.     Heart sounds: Normal heart sounds. No murmur heard. Pulmonary:     Effort: Pulmonary effort is normal. No respiratory distress.     Breath sounds: Normal breath sounds. No wheezing.  Musculoskeletal:        General: Normal range of motion.  Skin:    General: Skin is warm and dry.     Capillary Refill: Capillary refill takes less than 2 seconds.  Neurological:     General: No focal deficit present.     Mental Status:  She is alert and oriented to person, place, and time.  Psychiatric:        Mood and Affect: Mood normal.        Behavior: Behavior normal.        Thought Content: Thought content normal.        Judgment: Judgment normal.        Assessment And Plan:  Establishing care with new doctor, encounter for Assessment & Plan: Patient is here to establish care. Went over patient medical, family, social and surgical history. Reviewed with patient their medications and any allergies  Reviewed with patient their sexual orientation, drug/tobacco and alcohol use Dicussed any new concerns with patient  recommended patient comes in for a physical exam and complete blood work.  Educated patient about the importance of annual screenings and immunizations.  Advised patient to eat a healthy diet along with exercise for atleast 30-45 min atleast 4-5 days of the week.     Chronic hypertension Assessment & Plan: Blood pressure is slightly elevated, she has not been on her BP medications in quite some time. Sent Rx for her medications.   Orders: -     Losartan Potassium; Take 1 tablet (25 mg total) by mouth daily.  Dispense: 90 tablet; Refill: 1 -     CMP14+EGFR  Type 2 diabetes mellitus without complication, with long-term current use of insulin (HCC) Assessment & Plan: She has not been on medications in about 2 years, will restart her on insulin tresiba 10 units and Jardiance. She will also likely need short  acting insulin. I will also get her set up with the diabetic educator.   Orders: -     Microalbumin / creatinine urine ratio -     Hemoglobin A1c -     Empagliflozin; Take 1 tablet (10 mg total) by mouth daily before breakfast.  Dispense: 90 tablet; Refill: 1 -     Losartan Potassium; Take 1 tablet (25 mg total) by mouth daily.  Dispense: 90 tablet; Refill: 1 -     Ambulatory referral to Ophthalmology -     blood glucose meter kit and supplies; Dispense based on patient and insurance preference. Use up to four times daily as directed.  Dispense: 1 each; Refill: 0 -     CMP14+EGFR -     CBC with Differential/Platelet -     VITAMIN D 25 Hydroxy (Vit-D Deficiency, Fractures) -     Insulin Lispro (1 Unit Dial); Inject [place] insulin before meals and at hs if BS < 150 - 0 units, 150-199 - 3 units, 200-249 - 6 units, 250-299 - 9 units, 300-349 - 12 units, >350 - 15 units. If >400 call our office with your blood sugar readings.  Dispense: 15 mL; Refill: 3  Menorrhagia with regular cycle Assessment & Plan: This has been ongoing, may be related to her poorly controlled diabetes.   Orders: -     Ambulatory referral to Obstetrics / Gynecology -     Iron, TIBC and Ferritin Panel  Moderate episode of recurrent major depressive disorder (HCC) -     Ambulatory referral to Psychology  Influenza vaccination declined Assessment & Plan: Patient declined influenza vaccination at this time. Patient is aware that influenza vaccine prevents illness in 70% of healthy people, and reduces hospitalizations to 30-70% in elderly. This vaccine is recommended annually. Education has been provided regarding the importance of this vaccine but patient still declined. Advised may receive this vaccine at local pharmacy or Health Dept.or  vaccine clinic. Aware to provide a copy of the vaccination record if obtained from local pharmacy or Health Dept.  Pt is willing to accept risk associated with refusing  vaccination.    COVID-19 vaccination declined Assessment & Plan: Declines covid 19 vaccine. Discussed risk of covid 11 and if she changes her mind about the vaccine to call the office. Education has been provided regarding the importance of this vaccine but patient still declined. Advised may receive this vaccine at local pharmacy or Health Dept.or vaccine clinic. Aware to provide a copy of the vaccination record if obtained from local pharmacy or Health Dept.  Encouraged to take multivitamin, vitamin d, vitamin c and zinc to increase immune system. Aware can call office if would like to have vaccine here at office. Verbalized acceptance and understanding.    Screening for colon cancer Assessment & Plan: According to USPTF Colorectal cancer Screening guidelines. Cologuard is recommended every 3 years, starting at age 55 years. Order for cologuard sent   Orders: -     Ambulatory referral to Gastroenterology  Class 1 obesity due to excess calories with body mass index (BMI) of 32.0 to 32.9 in adult, unspecified whether serious comorbidity present Assessment & Plan: She is encouraged to strive for BMI less than 30 to decrease cardiac risk. Advised to aim for at least 150 minutes of exercise per week.    Encounter for screening mammogram for breast cancer -     Digital Screening Mammogram, Left and Right; Future  Other long term (current) drug therapy -     TSH  Type 2 diabetes mellitus with obesity (HCC) Assessment & Plan: She has not been on medications in about 2 years, will restart her on insulin tresiba 10 units and Jardiance. She will also likely need short acting insulin. I will also get her set up with the diabetic educator.    Other orders -     Magnesium Glycinate; Take 2 capsules by mouth daily.  Dispense: 60 capsule; Refill: 3    Return in about 3 months (around 09/09/2023) for phy when able.  Patient was given opportunity to ask questions. Patient verbalized  understanding of the plan and was able to repeat key elements of the plan. All questions were answered to their satisfaction.    Jeanell Sparrow, FNP, have reviewed all documentation for this visit. The documentation on 06/21/23 for the exam, diagnosis, procedures, and orders are all accurate and complete.   IF YOU HAVE BEEN REFERRED TO A SPECIALIST, IT MAY TAKE 1-2 WEEKS TO SCHEDULE/PROCESS THE REFERRAL. IF YOU HAVE NOT HEARD FROM US/SPECIALIST IN TWO WEEKS, PLEASE GIVE [place] A CALL AT [phone].

## 2023-06-12 NOTE — Patient Instructions (Addendum)
 [facility] PA- [phone]

## 2023-06-13 LAB — CBC WITH DIFFERENTIAL/PLATELET
Basophils Absolute: 0.1 10*3/uL (ref 0.0–0.2)
Basos: 1 %
EOS (ABSOLUTE): 0 10*3/uL (ref 0.0–0.4)
Eos: 0 %
Hematocrit: 36.2 % (ref 34.0–46.6)
Hemoglobin: 11.8 g/dL (ref 11.1–15.9)
Immature Grans (Abs): 0 10*3/uL (ref 0.0–0.1)
Immature Granulocytes: 0 %
Lymphocytes Absolute: 2.9 10*3/uL (ref 0.7–3.1)
Lymphs: 30 %
MCH: 30.2 pg (ref 26.6–33.0)
MCHC: 32.6 g/dL (ref 31.5–35.7)
MCV: 93 fL (ref 79–97)
Monocytes Absolute: 0.5 10*3/uL (ref 0.1–0.9)
Monocytes: 5 %
Neutrophils Absolute: 6.1 10*3/uL (ref 1.4–7.0)
Neutrophils: 64 %
Platelets: 436 10*3/uL (ref 150–450)
RBC: 3.91 x10E6/uL (ref 3.77–5.28)
RDW: 12.4 % (ref 11.7–15.4)
WBC: 9.6 10*3/uL (ref 3.4–10.8)

## 2023-06-13 LAB — CMP14+EGFR
ALT: 16 [IU]/L (ref 0–32)
AST: 15 [IU]/L (ref 0–40)
Albumin: 4.1 g/dL (ref 3.9–4.9)
Alkaline Phosphatase: 72 [IU]/L (ref 44–121)
BUN/Creatinine Ratio: 16 (ref 9–23)
BUN: 11 mg/dL (ref 6–24)
Bilirubin Total: 0.3 mg/dL (ref 0.0–1.2)
CO2: 26 mmol/L (ref 20–29)
Calcium: 9.5 mg/dL (ref 8.7–10.2)
Chloride: 99 mmol/L (ref 96–106)
Creatinine, Ser: 0.68 mg/dL (ref 0.57–1.00)
Globulin, Total: 2.8 g/dL (ref 1.5–4.5)
Glucose: 203 mg/dL — ABNORMAL HIGH (ref 70–99)
Potassium: 4.6 mmol/L (ref 3.5–5.2)
Sodium: 138 mmol/L (ref 134–144)
Total Protein: 6.9 g/dL (ref 6.0–8.5)
eGFR: 109 mL/min/{1.73_m2} (ref >59)

## 2023-06-13 LAB — VITAMIN D 25 HYDROXY (VIT D DEFICIENCY, FRACTURES): Vit D, 25-Hydroxy: 23.8 ng/mL — ABNORMAL LOW (ref 30.0–100.0)

## 2023-06-13 LAB — HEMOGLOBIN A1C
Est. average glucose Bld gHb Est-mCnc: 275 mg/dL
Hgb A1c MFr Bld: 11.2 % — ABNORMAL HIGH (ref 4.8–5.6)

## 2023-06-13 LAB — IRON,TIBC AND FERRITIN PANEL
Ferritin: 264 ng/mL — ABNORMAL HIGH (ref 15–150)
Iron Saturation: 33 % (ref 15–55)
Iron: 88 ug/dL (ref 27–159)
Total Iron Binding Capacity: 267 ug/dL (ref 250–450)
UIBC: 179 ug/dL (ref 131–425)

## 2023-06-13 LAB — MICROALBUMIN / CREATININE URINE RATIO
Creatinine, Urine: 185.5 mg/dL
Microalb/Creat Ratio: 14 mg/g{creat} (ref 0–29)
Microalbumin, Urine: 25.2 ug/mL

## 2023-06-13 LAB — TSH: TSH: 0.821 u[IU]/mL (ref 0.450–4.500)

## 2023-06-14 ENCOUNTER — Other Ambulatory Visit: Payer: Self-pay | Admitting: Nurse Practitioner

## 2023-06-14 ENCOUNTER — Encounter: Payer: Self-pay | Admitting: Nurse Practitioner

## 2023-06-14 DIAGNOSIS — E559 Vitamin D deficiency, unspecified: Secondary | ICD-10-CM

## 2023-06-14 MED ORDER — VITAMIN D (ERGOCALCIFEROL) 1.25 MG (50000 UNIT) PO CAPS: 50000.0000 [IU] | ORAL_CAPSULE | ORAL | 1 refills | Status: DC

## 2023-06-14 MED ORDER — TRESIBA FLEXTOUCH 100 UNIT/ML ~~LOC~~ SOPN: 10.0000 [IU] | PEN_INJECTOR | Freq: Every day | SUBCUTANEOUS | 1 refills | Status: DC

## 2023-06-21 ENCOUNTER — Other Ambulatory Visit: Payer: Self-pay | Admitting: Nurse Practitioner

## 2023-06-21 DIAGNOSIS — Z1211 Encounter for screening for malignant neoplasm of colon: Secondary | ICD-10-CM | POA: Insufficient documentation

## 2023-06-21 DIAGNOSIS — E66811 Obesity, class 1: Secondary | ICD-10-CM | POA: Insufficient documentation

## 2023-06-21 DIAGNOSIS — Z7689 Persons encountering health services in other specified circumstances: Secondary | ICD-10-CM | POA: Insufficient documentation

## 2023-06-21 DIAGNOSIS — Z2821 Immunization not carried out because of patient refusal: Secondary | ICD-10-CM | POA: Insufficient documentation

## 2023-06-21 DIAGNOSIS — Z1231 Encounter for screening mammogram for malignant neoplasm of breast: Secondary | ICD-10-CM | POA: Insufficient documentation

## 2023-06-21 DIAGNOSIS — F331 Major depressive disorder, recurrent, moderate: Secondary | ICD-10-CM | POA: Insufficient documentation

## 2023-06-21 DIAGNOSIS — Z794 Long term (current) use of insulin: Secondary | ICD-10-CM

## 2023-06-21 MED ORDER — INSULIN LISPRO (1 UNIT DIAL) 100 UNIT/ML (KWIKPEN): PEN_INJECTOR | SUBCUTANEOUS | 11 refills | Status: DC

## 2023-06-21 MED ORDER — INSULIN LISPRO (1 UNIT DIAL) 100 UNIT/ML (KWIKPEN): PEN_INJECTOR | SUBCUTANEOUS | 3 refills | Status: AC

## 2023-06-21 MED ORDER — DEXCOM G7 SENSOR MISC
2 refills | Status: DC
Start: 2023-06-21 — End: 2023-11-14

## 2023-06-21 NOTE — Assessment & Plan Note (Signed)
 She has not been on medications in about 2 years, will restart her on insulin tresiba 10 units and Jardiance. She will also likely need short acting insulin. I will also get her set up with the diabetic educator.

## 2023-06-21 NOTE — Assessment & Plan Note (Signed)

## 2023-06-21 NOTE — Assessment & Plan Note (Signed)

## 2023-06-21 NOTE — Assessment & Plan Note (Signed)

## 2023-06-21 NOTE — Assessment & Plan Note (Signed)
 Blood pressure is slightly elevated, she has not been on her BP medications in quite some time. Sent Rx for her medications.

## 2023-06-21 NOTE — Assessment & Plan Note (Signed)
 I will refer to counseling.

## 2023-06-21 NOTE — Assessment & Plan Note (Signed)
 This has been ongoing, may be related to her poorly controlled diabetes.

## 2023-06-21 NOTE — Assessment & Plan Note (Signed)
 According to USPTF Colorectal cancer Screening guidelines. Cologuard is recommended every 3 years, starting at age 46 years. Order for cologuard sent

## 2023-06-21 NOTE — Assessment & Plan Note (Signed)
 She is encouraged to strive for BMI less than 30 to decrease cardiac risk. Advised to aim for at least 150 minutes of exercise per week.

## 2023-06-23 ENCOUNTER — Other Ambulatory Visit: Payer: Self-pay | Admitting: Nurse Practitioner

## 2023-06-23 DIAGNOSIS — Z794 Long term (current) use of insulin: Secondary | ICD-10-CM

## 2023-06-26 MED ORDER — BLOOD GLUCOSE MONITOR KIT: PACK | 0 refills | Status: AC

## 2023-06-29 ENCOUNTER — Other Ambulatory Visit: Payer: Self-pay

## 2023-06-29 MED ORDER — BD PEN NEEDLE NANO 2ND GEN 32G X 4 MM MISC: 2 refills | Status: DC

## 2023-07-03 ENCOUNTER — Telehealth: Payer: Self-pay

## 2023-07-03 NOTE — Telephone Encounter (Signed)
 Patients PA for tresiba sent to plan.

## 2023-07-03 NOTE — Telephone Encounter (Signed)
 PA for dexcom sent.

## 2023-07-03 NOTE — Telephone Encounter (Signed)
 PA APPROVED

## 2023-07-04 ENCOUNTER — Telehealth: Payer: Self-pay

## 2023-07-04 NOTE — Telephone Encounter (Signed)
 APPROVED 07/03/2023-07/02/2024.  REQUEST NUMBER [id number]

## 2023-07-04 NOTE — Telephone Encounter (Signed)
 REQUEST NUMBER [id number]

## 2023-07-04 NOTE — Telephone Encounter (Signed)
 Jardiance 10mg  approved 06/22/2023-06/21/2024 Request number [id number]

## 2023-07-04 NOTE — Telephone Encounter (Signed)
 Dexcom denied reason " we did not see what we need to approve the device you asked for."

## 2023-07-12 ENCOUNTER — Other Ambulatory Visit: Payer: Self-pay | Admitting: Nurse Practitioner

## 2023-07-12 DIAGNOSIS — I10 Essential (primary) hypertension: Secondary | ICD-10-CM

## 2023-07-12 MED ORDER — HYDROCHLOROTHIAZIDE 25 MG PO TABS
25.0000 mg | ORAL_TABLET | Freq: Every day | ORAL | 1 refills | Status: DC
Start: 2023-07-12 — End: 2024-01-15

## 2023-07-13 LAB — HM DIABETES EYE EXAM

## 2023-07-13 NOTE — Progress Notes (Unsigned)
 48 y.o. [id number] Single Other or two or more races female here for annual exam.    No LMP recorded.          Sexually active: {yes no:314532}  The current method of family planning is {contraception:315051}.     The pregnancy intention screening data noted above was reviewed. Potential methods of contraception were discussed. The patient elected to proceed with No data recorded.  Exercising: {yes no:314532}  {types:19826} Smoker:  {YES [id number]  Health Maintenance: Pap:  08/10/2020 History of abnormal Pap:  {YES NO:22349} MMG:  10/22/2020 Colonoscopy:   BMD:    Screening Labs:    reports that she has quit smoking. Her smoking use included cigarettes. She has never used smokeless tobacco. She reports current alcohol use. She reports that she does not use drugs.  Past Medical History:  Diagnosis Date   Diabetes mellitus without complication (HCC)    Hypertension     Past Surgical History:  Procedure Laterality Date   CESAREAN SECTION     CESAREAN SECTION WITH BILATERAL TUBAL LIGATION Bilateral 04/11/2019   Procedure: CESAREAN SECTION WITH BILATERAL TUBAL LIGATION;  Surgeon: Tereso Newcomer, MD;  Location: [facility] LD ORS;  Service: Obstetrics;  Laterality: Bilateral;    Current Outpatient Medications  Medication Sig Dispense Refill   Accu-Chek FastClix Lancets MISC USE 4 TIMES A DAY AS DIRECTED 102 each 12   benzonatate (TESSALON) 100 MG capsule Take 1 capsule (100 mg total) by mouth 3 (three) times daily as needed for cough. 30 capsule 0   blood glucose meter kit and supplies KIT Dispense based on patient and insurance preference. Use up to four times daily as directed. 1 each 0   Blood Glucose Monitoring Suppl (ACCU-CHEK GUIDE) w/Device KIT 1 Device by Does not apply route 4 (four) times daily. 1 kit 0   Blood Pressure Monitor KIT 1 kit by Does not apply route once a week. CHECK BP WEEKLY.  LARGE CUFF.  DX:  Z13.6         Z34.86 (Patient not taking: Reported on 06/12/2023) 1 kit  0   brompheniramine-pseudoephedrine-DM 30-2-10 MG/5ML syrup Take 5 mLs by mouth 4 (four) times daily as needed. 120 mL 0   Continuous Glucose Sensor (DEXCOM G7 SENSOR) MISC USE DEXCOM TO CHECK BLOOD SUGARS 3 TIMES DAILY. 3 each 2   empagliflozin (JARDIANCE) 10 MG TABS tablet Take 1 tablet (10 mg total) by mouth daily before breakfast. 90 tablet 1   fluticasone (FLONASE) 50 MCG/ACT nasal spray Place 2 sprays into both nostrils daily. 16 g 0   glucose blood (ACCU-CHEK GUIDE) test strip USE TO CHECK BLOOD SUGARS FOUR TIMES A DAY *WAS INSTRUCTED* 50 strip 12   hydrochlorothiazide (HYDRODIURIL) 25 MG tablet Take 1 tablet (25 mg total) by mouth daily. 90 tablet 1   insulin degludec (TRESIBA FLEXTOUCH) 100 UNIT/ML FlexTouch Pen Inject 10 Units into the skin daily. 15 mL 1   insulin lispro (HUMALOG KWIKPEN) 100 UNIT/ML KwikPen Inject [place] insulin before meals and at hs if BS < 150 - 0 units, 150-199 - 3 units, 200-249 - 6 units, 250-299 - 9 units, 300-349 - 12 units, >350 - 15 units. If >400 call our office with your blood sugar readings. 15 mL 3   Insulin Pen Needle (BD PEN NEEDLE NANO 2ND GEN) 32G X 4 MM MISC Use as directed for insulin 100 each 2   ipratropium (ATROVENT) 0.03 % nasal spray Place 2 sprays into both nostrils every 12 (twelve) hours.  30 mL 0   losartan (COZAAR) 25 MG tablet Take 1 tablet (25 mg total) by mouth daily. 90 tablet 1   Magnesium Glycinate 100 MG CAPS Take 2 capsules by mouth daily. 60 capsule 3   ondansetron (ZOFRAN-ODT) 4 MG disintegrating tablet Take 1 tablet (4 mg total) by mouth every 8 (eight) hours as needed. (Patient not taking: Reported on 06/12/2023) 20 tablet 0   Vitamin D, Ergocalciferol, (DRISDOL) 1.25 MG (50000 UNIT) CAPS capsule Take 1 capsule (50,000 Units total) by mouth every 7 (seven) days. 12 capsule 1   No current facility-administered medications for this visit.    Family History  Problem Relation Age of Onset   Diabetes Mother    Hypertension Mother      ROS: Constitutional: {Findings; ROS constitutional:30497::"negative"} Genitourinary:{Findings; ROS genitourinary:19593::"negative"}  Exam:   There were no vitals taken for this visit.     General appearance: alert, cooperative and appears stated age Head: Normocephalic, without obvious abnormality, atraumatic Neck: no adenopathy, supple, symmetrical, trachea midline and thyroid {EXAM; THYROID:18604} Lungs: clear to auscultation bilaterally Breasts: {Exam; breast:13139::"normal appearance, no masses or tenderness"} Heart: regular rate and rhythm Abdomen: soft, non-tender; bowel sounds normal; no masses,  no organomegaly Extremities: extremities normal, atraumatic, no cyanosis or edema Skin: Skin color, texture, turgor normal. No rashes or lesions Lymph nodes: Cervical, supraclavicular, and axillary nodes normal. No abnormal inguinal nodes palpated Neurologic: Grossly normal   Pelvic: External genitalia:  no lesions              Urethra:  normal appearing urethra with no masses, tenderness or lesions              Bartholins and Skenes: normal                 Vagina: normal appearing vagina with normal color and no discharge, no lesions              Cervix: {exam; cervix:14595}              Pap taken: {yes no:314532} Bimanual Exam:  Uterus:  {exam; uterus:12215}              Adnexa: {exam; adnexa:12223}               Rectovaginal: Confirms               Anus:  normal sphincter tone, no lesions  Chaperone, ***, CMA, was present for exam.  Assessment/Plan:

## 2023-07-14 ENCOUNTER — Other Ambulatory Visit (HOSPITAL_COMMUNITY)
Admission: RE | Admit: 2023-07-14 | Discharge: 2023-07-14 | Disposition: A | Discharge status: Home / Self Care | Source: Ambulatory Visit | Attending: Certified Nurse Midwife | Admitting: Certified Nurse Midwife

## 2023-07-14 ENCOUNTER — Ambulatory Visit (HOSPITAL_BASED_OUTPATIENT_CLINIC_OR_DEPARTMENT_OTHER): Payer: Medicaid Other | Admitting: Certified Nurse Midwife

## 2023-07-14 ENCOUNTER — Encounter (HOSPITAL_BASED_OUTPATIENT_CLINIC_OR_DEPARTMENT_OTHER): Payer: Self-pay | Admitting: Certified Nurse Midwife

## 2023-07-14 VITALS — BP 165/104 | HR 86 | Ht 68.0 in | Wt 212.0 lb

## 2023-07-14 DIAGNOSIS — N920 Excessive and frequent menstruation with regular cycle: Secondary | ICD-10-CM | POA: Insufficient documentation

## 2023-07-14 DIAGNOSIS — N946 Dysmenorrhea, unspecified: Secondary | ICD-10-CM

## 2023-07-14 DIAGNOSIS — N898 Other specified noninflammatory disorders of vagina: Secondary | ICD-10-CM | POA: Insufficient documentation

## 2023-07-14 DIAGNOSIS — Z1151 Encounter for screening for human papillomavirus (HPV): Secondary | ICD-10-CM | POA: Insufficient documentation

## 2023-07-14 DIAGNOSIS — Z124 Encounter for screening for malignant neoplasm of cervix: Secondary | ICD-10-CM | POA: Insufficient documentation

## 2023-07-14 DIAGNOSIS — Z1231 Encounter for screening mammogram for malignant neoplasm of breast: Secondary | ICD-10-CM

## 2023-07-14 DIAGNOSIS — Z113 Encounter for screening for infections with a predominantly sexual mode of transmission: Secondary | ICD-10-CM | POA: Diagnosis not present

## 2023-07-17 ENCOUNTER — Encounter (HOSPITAL_BASED_OUTPATIENT_CLINIC_OR_DEPARTMENT_OTHER): Payer: Self-pay | Admitting: Certified Nurse Midwife

## 2023-07-17 ENCOUNTER — Other Ambulatory Visit (HOSPITAL_BASED_OUTPATIENT_CLINIC_OR_DEPARTMENT_OTHER): Payer: Self-pay | Admitting: Certified Nurse Midwife

## 2023-07-17 LAB — CERVICOVAGINAL ANCILLARY ONLY
Bacterial Vaginitis (gardnerella): POSITIVE — AB
Candida Glabrata: NEGATIVE
Candida Vaginitis: NEGATIVE
Comment: NEGATIVE
Comment: NEGATIVE
Comment: NEGATIVE

## 2023-07-17 MED ORDER — FLUCONAZOLE 150 MG PO TABS
150.0000 mg | ORAL_TABLET | ORAL | 2 refills | Status: DC | PRN
Start: 2023-07-17 — End: 2023-09-19

## 2023-07-17 MED ORDER — METRONIDAZOLE 500 MG PO TABS: 500.0000 mg | ORAL_TABLET | Freq: Two times a day (BID) | ORAL | 3 refills | Status: DC

## 2023-07-18 LAB — CYTOLOGY - PAP
Chlamydia: NEGATIVE
Comment: NEGATIVE
Comment: NEGATIVE
Comment: NEGATIVE
Comment: NORMAL
Diagnosis: NEGATIVE
High risk HPV: NEGATIVE
Neisseria Gonorrhea: NEGATIVE
Trichomonas: NEGATIVE

## 2023-07-26 ENCOUNTER — Encounter (HOSPITAL_BASED_OUTPATIENT_CLINIC_OR_DEPARTMENT_OTHER): Payer: Self-pay | Admitting: Radiology

## 2023-07-26 ENCOUNTER — Ambulatory Visit (HOSPITAL_BASED_OUTPATIENT_CLINIC_OR_DEPARTMENT_OTHER)
Admission: RE | Admit: 2023-07-26 | Discharge: 2023-07-26 | Disposition: A | Discharge status: Home / Self Care | Source: Ambulatory Visit | Attending: Certified Nurse Midwife | Admitting: Certified Nurse Midwife

## 2023-07-26 DIAGNOSIS — Z1231 Encounter for screening mammogram for malignant neoplasm of breast: Secondary | ICD-10-CM | POA: Insufficient documentation

## 2023-07-31 ENCOUNTER — Telehealth: Payer: Self-pay

## 2023-07-31 NOTE — Telephone Encounter (Signed)
 Dexcom PA  [employer] reviewed your request for [facility] G7 SENSOR for the above-identified member,  and it is approved as follows: DEXCOM G7 SENSOR: quantity/billable units of 3 approved for 07/31/2023-01/27/2024. Approved J code (if applicable): [id number]

## 2023-08-10 ENCOUNTER — Encounter (HOSPITAL_BASED_OUTPATIENT_CLINIC_OR_DEPARTMENT_OTHER): Payer: Self-pay | Admitting: Certified Nurse Midwife

## 2023-08-10 ENCOUNTER — Encounter (HOSPITAL_BASED_OUTPATIENT_CLINIC_OR_DEPARTMENT_OTHER): Payer: Self-pay

## 2023-08-10 ENCOUNTER — Encounter (HOSPITAL_BASED_OUTPATIENT_CLINIC_OR_DEPARTMENT_OTHER): Admitting: Certified Nurse Midwife

## 2023-08-10 NOTE — Progress Notes (Signed)
 Patient was supposed to be schedule for [place] (and desires [place]) but today's appt was not scheduled for [place].She is going to reschedule appt for [place]. Miranda Barnett

## 2023-09-06 ENCOUNTER — Ambulatory Visit (HOSPITAL_BASED_OUTPATIENT_CLINIC_OR_DEPARTMENT_OTHER)

## 2023-09-06 ENCOUNTER — Ambulatory Visit (HOSPITAL_BASED_OUTPATIENT_CLINIC_OR_DEPARTMENT_OTHER): Admitting: Certified Nurse Midwife

## 2023-09-06 ENCOUNTER — Encounter (HOSPITAL_BASED_OUTPATIENT_CLINIC_OR_DEPARTMENT_OTHER): Payer: Self-pay | Admitting: Certified Nurse Midwife

## 2023-09-06 ENCOUNTER — Other Ambulatory Visit (HOSPITAL_BASED_OUTPATIENT_CLINIC_OR_DEPARTMENT_OTHER): Payer: Self-pay | Admitting: Obstetrics & Gynecology

## 2023-09-06 VITALS — BP 141/90 | HR 82 | Ht 68.0 in | Wt 221.0 lb

## 2023-09-06 DIAGNOSIS — N921 Excessive and frequent menstruation with irregular cycle: Secondary | ICD-10-CM | POA: Diagnosis not present

## 2023-09-06 DIAGNOSIS — N8301 Follicular cyst of right ovary: Secondary | ICD-10-CM

## 2023-09-06 DIAGNOSIS — N939 Abnormal uterine and vaginal bleeding, unspecified: Secondary | ICD-10-CM | POA: Diagnosis not present

## 2023-09-06 DIAGNOSIS — Z98891 History of uterine scar from previous surgery: Secondary | ICD-10-CM | POA: Diagnosis not present

## 2023-09-06 DIAGNOSIS — L680 Hirsutism: Secondary | ICD-10-CM | POA: Diagnosis not present

## 2023-09-06 NOTE — Progress Notes (Signed)
 GYNECOLOGY  VISIT  CC:   US  (menorrhagia/dysmenorrhea), discuss hormone/hirsutism  HPI: 48 y.o. [phone] Single Other or two or more races female here for US . Her medical hx is significant for Type 2 DM and Essential Hypertension. Patient reported menorrhagia with irregular cycles.   Pt was evaluated 07/14/23 and reported menorrhagia and dysmenorrhea. She returns today for US  to evaluate uterus/ovaries. Pt also bothered by facial hirsutism affecting chin region. States this issue has exacerbated over the past few years.   US  (09/06/23): Anteverted uterus appears normal in size without evidence of focal abnormalities. EM 7-8 mm with no blood flow Normal ovaries bilaterally with perfusion Right ovary has 2 follicles, 1 collapsing Negative adnexal regions bilaterally Neg cul de sac, no free fluid present Cxl wnl   Past Medical History:  Diagnosis Date   Diabetes mellitus without complication (HCC)    Hypertension     MEDS:   Current Outpatient Medications on File Prior to Visit  Medication Sig Dispense Refill   Accu-Chek FastClix Lancets MISC USE 4 TIMES A DAY AS DIRECTED 102 each 12   blood glucose meter kit and supplies KIT Dispense based on patient and insurance preference. Use up to four times daily as directed. 1 each 0   Blood Glucose Monitoring Suppl (ACCU-CHEK GUIDE) w/Device KIT 1 Device by Does not apply route 4 (four) times daily. 1 kit 0   Blood Pressure Monitor KIT 1 kit by Does not apply route once a week. CHECK BP WEEKLY.  LARGE CUFF.  DX:  Z13.6         Z34.86 1 kit 0   Continuous Glucose Sensor (DEXCOM G7 SENSOR) MISC USE DEXCOM TO CHECK BLOOD SUGARS 3 TIMES DAILY. 3 each 2   empagliflozin  (JARDIANCE ) 10 MG TABS tablet Take 1 tablet (10 mg total) by mouth daily before breakfast. 90 tablet 1   fluconazole  (DIFLUCAN ) 150 MG tablet Take 1 tablet (150 mg total) by mouth every other day as needed. (Patient not taking: Reported on 09/06/2023) 2 tablet 2   fluticasone   (FLONASE ) 50 MCG/ACT nasal spray Place 2 sprays into both nostrils daily. 16 g 0   glucose blood (ACCU-CHEK GUIDE) test strip USE TO CHECK BLOOD SUGARS FOUR TIMES A DAY *WAS INSTRUCTED* 50 strip 12   hydrochlorothiazide  (HYDRODIURIL ) 25 MG tablet Take 1 tablet (25 mg total) by mouth daily. 90 tablet 1   insulin  degludec (TRESIBA  FLEXTOUCH) 100 UNIT/ML FlexTouch Pen Inject 10 Units into the skin daily. 15 mL 1   insulin  lispro (HUMALOG  KWIKPEN) 100 UNIT/ML KwikPen Inject [place] insulin  before meals and at hs if BS < 150 - 0 units, 150-199 - 3 units, 200-249 - 6 units, 250-299 - 9 units, 300-349 - 12 units, >350 - 15 units. If >400 call our office with your blood sugar readings. 15 mL 3   Insulin  Pen Needle (BD PEN NEEDLE NANO 2ND GEN) 32G X 4 MM MISC Use as directed for insulin  100 each 2   ipratropium (ATROVENT ) 0.03 % nasal spray Place 2 sprays into both nostrils every 12 (twelve) hours. 30 mL 0   losartan  (COZAAR ) 25 MG tablet Take 1 tablet (25 mg total) by mouth daily. 90 tablet 1   Magnesium  Glycinate 100 MG CAPS Take 2 capsules by mouth daily. 60 capsule 3   Vitamin D , Ergocalciferol , (DRISDOL ) 1.25 MG (50000 UNIT) CAPS capsule Take 1 capsule (50,000 Units total) by mouth every 7 (seven) days. 12 capsule 1   brompheniramine-pseudoephedrine-DM 30-2-10 MG/5ML syrup Take 5  mLs by mouth 4 (four) times daily as needed. (Patient not taking: Reported on 09/06/2023) 120 mL 0   metroNIDAZOLE  (FLAGYL ) 500 MG tablet Take 1 tablet (500 mg total) by mouth 2 (two) times daily. (Patient not taking: Reported on 09/06/2023) 14 tablet 3   ondansetron  (ZOFRAN -ODT) 4 MG disintegrating tablet Take 1 tablet (4 mg total) by mouth every 8 (eight) hours as needed. (Patient not taking: Reported on 09/06/2023) 20 tablet 0   No current facility-administered medications on file prior to visit.    ALLERGIES: Patient has no known allergies.   Review of Systems  Genitourinary:        Menorrhagia and dysmenorrhea    PHYSICAL  EXAMINATION:    BP (!) 141/90 (BP Location: Right Arm, Patient Position: Sitting, Cuff Size: Large) Comment: Patient did not take her blood pressure meds this morning  Pulse 82   Ht 5\' 8"  (1.727 m) Comment: Reported  Wt 221 lb (100.2 kg)   LMP 08/28/2023   BMI 33.60 kg/m     General appearance: alert, cooperative and appears stated age Face: Hirsutism present, chin/face   Assessment/Plan: 1. Hirsutism (Primary) - Long discussion today re: possibility of PCOS (DM, BMI, hirsutism, etc.) - Pt declines Metformin  (developed restless leg syndrome), but may be agreeable to Spironolactone - DHEA-sulfate - TestT+TestF+SHBG - Progesterone  2. Menorrhagia with irregular cycle - US  complete, no abnormalities note - Pt encouraged to consider Progesterone Only Pill or Mirena IUD (she did dilate in labor but had Csx3) - discussed all available hormonal progesterone-only options for control of menorrhagia (POP, IUD, nexplanon, Depo). Declines all at this time.  - Not a candidate for estrogen at this time (hypertension) - Progesterone - Recent TSH was within normal range  Abe Abed K Taylen Wendland

## 2023-09-09 LAB — TESTT+TESTF+SHBG
Sex Hormone Binding: 65.4 nmol/L (ref 24.6–122.0)
Testosterone, Free: 0.3 pg/mL (ref 0.0–4.2)
Testosterone, Total, LC/MS: 26.6 ng/dL

## 2023-09-09 LAB — PROGESTERONE: Progesterone: 6.7 ng/mL

## 2023-09-09 LAB — DHEA-SULFATE: DHEA-SO4: 38.4 ug/dL — ABNORMAL LOW (ref 41.2–243.7)

## 2023-09-12 ENCOUNTER — Other Ambulatory Visit (HOSPITAL_BASED_OUTPATIENT_CLINIC_OR_DEPARTMENT_OTHER): Payer: Self-pay | Admitting: Certified Nurse Midwife

## 2023-09-12 ENCOUNTER — Ambulatory Visit (HOSPITAL_BASED_OUTPATIENT_CLINIC_OR_DEPARTMENT_OTHER): Payer: Self-pay | Admitting: Certified Nurse Midwife

## 2023-09-12 DIAGNOSIS — L680 Hirsutism: Secondary | ICD-10-CM

## 2023-09-12 MED ORDER — EFLORNITHINE HCL 13.9 % EX CREA
1.0000 | TOPICAL_CREAM | Freq: Two times a day (BID) | CUTANEOUS | 1 refills | Status: AC
Start: 2023-09-12 — End: ?

## 2023-09-19 ENCOUNTER — Ambulatory Visit: Payer: Medicaid Other | Admitting: Nurse Practitioner

## 2023-09-19 ENCOUNTER — Encounter: Payer: Self-pay | Admitting: Nurse Practitioner

## 2023-09-19 VITALS — BP 120/80 | HR 102 | Temp 98.5°F | Ht 68.0 in | Wt 223.6 lb

## 2023-09-19 DIAGNOSIS — E1169 Type 2 diabetes mellitus with other specified complication: Secondary | ICD-10-CM

## 2023-09-19 DIAGNOSIS — Z2821 Immunization not carried out because of patient refusal: Secondary | ICD-10-CM

## 2023-09-19 DIAGNOSIS — R200 Anesthesia of skin: Secondary | ICD-10-CM | POA: Diagnosis not present

## 2023-09-19 DIAGNOSIS — E66811 Obesity, class 1: Secondary | ICD-10-CM | POA: Diagnosis not present

## 2023-09-19 DIAGNOSIS — Z6834 Body mass index (BMI) 34.0-34.9, adult: Secondary | ICD-10-CM | POA: Diagnosis not present

## 2023-09-19 DIAGNOSIS — Z0000 Encounter for general adult medical examination without abnormal findings: Secondary | ICD-10-CM

## 2023-09-19 DIAGNOSIS — R5382 Chronic fatigue, unspecified: Secondary | ICD-10-CM | POA: Insufficient documentation

## 2023-09-19 DIAGNOSIS — I10 Essential (primary) hypertension: Secondary | ICD-10-CM

## 2023-09-19 DIAGNOSIS — R5383 Other fatigue: Secondary | ICD-10-CM | POA: Diagnosis not present

## 2023-09-19 DIAGNOSIS — E6609 Other obesity due to excess calories: Secondary | ICD-10-CM

## 2023-09-19 DIAGNOSIS — Z79899 Other long term (current) drug therapy: Secondary | ICD-10-CM | POA: Diagnosis not present

## 2023-09-19 DIAGNOSIS — E559 Vitamin D deficiency, unspecified: Secondary | ICD-10-CM | POA: Diagnosis not present

## 2023-09-19 DIAGNOSIS — E1165 Type 2 diabetes mellitus with hyperglycemia: Secondary | ICD-10-CM

## 2023-09-19 DIAGNOSIS — E119 Type 2 diabetes mellitus without complications: Secondary | ICD-10-CM | POA: Insufficient documentation

## 2023-09-19 DIAGNOSIS — R202 Paresthesia of skin: Secondary | ICD-10-CM | POA: Diagnosis not present

## 2023-09-19 LAB — POCT URINALYSIS DIP (CLINITEK)
Bilirubin, UA: NEGATIVE
Blood, UA: NEGATIVE
Glucose, UA: 500 mg/dL — AB
Leukocytes, UA: NEGATIVE
Nitrite, UA: NEGATIVE
POC PROTEIN,UA: NEGATIVE
Spec Grav, UA: 1.02 (ref 1.010–1.025)
Urobilinogen, UA: 0.2 U/dL
pH, UA: 6 (ref 5.0–8.0)

## 2023-09-19 MED ORDER — SEMAGLUTIDE(0.25 OR 0.5MG/DOS) 2 MG/3ML ~~LOC~~ SOPN
0.5000 mg | PEN_INJECTOR | SUBCUTANEOUS | 1 refills | Status: DC
Start: 2023-09-19 — End: 2023-12-28

## 2023-09-19 MED ORDER — TRESIBA FLEXTOUCH 100 UNIT/ML ~~LOC~~ SOPN: 10.0000 [IU] | PEN_INJECTOR | Freq: Every day | SUBCUTANEOUS | 1 refills | Status: DC

## 2023-09-19 MED ORDER — GVOKE HYPOPEN 2-PACK 0.5 MG/0.1ML ~~LOC~~ SOAJ: 0.5000 mg | SUBCUTANEOUS | 2 refills | Status: AC | PRN

## 2023-09-19 NOTE — Progress Notes (Unsigned)
 Inge Mangle, FNP,acting as a Neurosurgeon for Susanna Epley, FNP.,have documented all relevant documentation on the behalf of Susanna Epley, FNP,as directed by  Susanna Epley, FNP while in the presence of Susanna Epley, FNP.  Subjective:    Patient ID: Miranda Barnett , female    DOB: 1975-07-22 , 48 y.o.   MRN: [id number]  Chief Complaint  Patient presents with   Annual Exam    Patient presents today for HM, Patient reports compliance with medication. Patient denies any chest pain, SOB, or headaches.   Numbness    Patient reports numbness and tingling in her toes she reports it is mostly when her blood sugars are high.    Diabetes    She would like her tresiba  increased.     HPI  LMP - 09/11/2023 - she is spotting currently. She has been seeing GYN about her menstrual cycles. She is using lispro 2-3 times a day. Rarely 3 times a day.   Diabetes She presents for her follow-up diabetic visit. She has type 2 diabetes mellitus. Her disease course has been improving. Pertinent negatives for diabetes include no chest pain, no polydipsia, no polyphagia, no polyuria and no weakness. (Using Dexcom highest 280, she will go as low as 42. )     Past Medical History:  Diagnosis Date   Diabetes mellitus without complication (HCC)    Hypertension      Family History  Problem Relation Age of Onset   Diabetes Mother    Hypertension Mother      Current Outpatient Medications:    Accu-Chek FastClix Lancets MISC, USE 4 TIMES A DAY AS DIRECTED, Disp: 102 each, Rfl: 12   blood glucose meter kit and supplies KIT, Dispense based on patient and insurance preference. Use up to four times daily as directed., Disp: 1 each, Rfl: 0   Blood Glucose Monitoring Suppl (ACCU-CHEK GUIDE) w/Device KIT, 1 Device by Does not apply route 4 (four) times daily., Disp: 1 kit, Rfl: 0   Blood Pressure Monitor KIT, 1 kit by Does not apply route once a week. CHECK BP WEEKLY.  LARGE CUFF.  DX:  Z13.6         Z34.86, Disp: 1 kit, Rfl:  0   Continuous Glucose Sensor (DEXCOM G7 SENSOR) MISC, USE DEXCOM TO CHECK BLOOD SUGARS 3 TIMES DAILY., Disp: 3 each, Rfl: 2   Eflornithine HCl 13.9 % cream, Apply 1 Application topically 2 (two) times daily with a meal. Apply topically (thin film) twice daily at least 8 hours apart, Disp: 45 g, Rfl: 1   empagliflozin  (JARDIANCE ) 10 MG TABS tablet, Take 1 tablet (10 mg total) by mouth daily before breakfast., Disp: 90 tablet, Rfl: 1   fluticasone  (FLONASE ) 50 MCG/ACT nasal spray, Place 2 sprays into both nostrils daily., Disp: 16 g, Rfl: 0   Glucagon (GVOKE HYPOPEN 2-PACK) 0.5 MG/0.1ML SOAJ, Inject 0.5 mg into the skin as needed. Take when blood sugar less than 65, Disp: 0.2 mL, Rfl: 2   glucose blood (ACCU-CHEK GUIDE) test strip, USE TO CHECK BLOOD SUGARS FOUR TIMES A DAY *WAS INSTRUCTED*, Disp: 50 strip, Rfl: 12   hydrochlorothiazide  (HYDRODIURIL ) 25 MG tablet, Take 1 tablet (25 mg total) by mouth daily., Disp: 90 tablet, Rfl: 1   insulin  lispro (HUMALOG  KWIKPEN) 100 UNIT/ML KwikPen, Inject [place] insulin  before meals and at hs if BS < 150 - 0 units, 150-199 - 3 units, 200-249 - 6 units, 250-299 - 9 units, 300-349 - 12 units, >350 - 15 units.  If >400 call our office with your blood sugar readings., Disp: 15 mL, Rfl: 3   Insulin  Pen Needle (BD PEN NEEDLE NANO 2ND GEN) 32G X 4 MM MISC, Use as directed for insulin , Disp: 100 each, Rfl: 2   ipratropium (ATROVENT ) 0.03 % nasal spray, Place 2 sprays into both nostrils every 12 (twelve) hours., Disp: 30 mL, Rfl: 0   losartan  (COZAAR ) 25 MG tablet, Take 1 tablet (25 mg total) by mouth daily., Disp: 90 tablet, Rfl: 1   Magnesium  Glycinate 100 MG CAPS, Take 2 capsules by mouth daily., Disp: 60 capsule, Rfl: 3   Semaglutide,0.25 or 0.5MG /DOS, 2 MG/3ML SOPN, Inject 0.5 mg into the skin once a week., Disp: 9 mL, Rfl: 1   Vitamin D , Ergocalciferol , (DRISDOL ) 1.25 MG (50000 UNIT) CAPS capsule, Take 1 capsule (50,000 Units total) by mouth every 7 (seven) days., Disp: 12  capsule, Rfl: 1   insulin  degludec (TRESIBA  FLEXTOUCH) 100 UNIT/ML FlexTouch Pen, Inject 10 Units into the skin daily., Disp: 15 mL, Rfl: 1   No Known Allergies    The patient states she uses tubal ligation for birth control. Patient's last menstrual period was 08/28/2023.   Negative for Dysmenorrhea and Negative for Menorrhagia. Negative for: breast discharge, breast lump(s), breast pain and breast self exam. Associated symptoms include abnormal vaginal bleeding. Pertinent negatives include abnormal bleeding (hematology), anxiety, decreased libido, depression, difficulty falling sleep, dyspareunia, history of infertility, nocturia, sexual dysfunction, sleep disturbances, urinary incontinence, urinary urgency, vaginal discharge and vaginal itching. Diet regular; continues to eat fast food, she is down to one soda a day, her Dexcom is making her more conscious of what she is eating and drinking.  The patient states her exercise level is minimal - she is trying to incorporate her physical activity with the kids lifestyle.   The patient's tobacco use is:  Social History   Tobacco Use  Smoking Status Former   Types: Cigarettes  Smokeless Tobacco Never  Tobacco Comments   prior to +UPT    She has been exposed to passive smoke. The patient's alcohol use is:  Social History   Substance and Sexual Activity  Alcohol Use Yes   Comment: once or twice a month   Additional information: Last pap 07/14/2023, next one scheduled for 07/14/2026.    Review of Systems  Constitutional: Negative.   HENT: Negative.    Eyes: Negative.   Respiratory: Negative.    Cardiovascular: Negative.  Negative for chest pain.  Gastrointestinal: Negative.   Endocrine: Negative.  Negative for polydipsia, polyphagia and polyuria.  Genitourinary: Negative.   Musculoskeletal: Negative.   Skin: Negative.   Allergic/Immunologic: Negative.   Neurological: Negative.  Negative for weakness.  Hematological: Negative.    Psychiatric/Behavioral: Negative.       Today's Vitals   09/19/23 1038  BP: 120/80  Pulse: (!) 102  Temp: 98.5 F (36.9 C)  TempSrc: Oral  Weight: 223 lb 9.6 oz (101.4 kg)  Height: 5\' 8"  (1.727 m)  PainSc: 0-No pain   Body mass index is 34 kg/m.  Wt Readings from Last 3 Encounters:  09/19/23 223 lb 9.6 oz (101.4 kg)  09/06/23 221 lb (100.2 kg)  08/10/23 214 lb 9.6 oz (97.3 kg)     Objective:  Physical Exam Vitals and nursing note reviewed.  Constitutional:      General: She is not in acute distress.    Appearance: Normal appearance. She is well-developed. She is obese.  HENT:     Head: Normocephalic and  atraumatic.     Right Ear: Hearing, tympanic membrane, ear canal and external ear normal. There is no impacted cerumen.     Left Ear: Hearing, tympanic membrane, ear canal and external ear normal. There is no impacted cerumen.     Nose: Nose normal.     Mouth/Throat:     Mouth: Mucous membranes are moist.  Eyes:     General: Lids are normal.     Extraocular Movements: Extraocular movements intact.     Conjunctiva/sclera: Conjunctivae normal.     Pupils: Pupils are equal, round, and reactive to light.     Funduscopic exam:    Right eye: No papilledema.        Left eye: No papilledema.  Neck:     Thyroid : No thyroid  mass.     Vascular: No carotid bruit.  Cardiovascular:     Rate and Rhythm: Normal rate and regular rhythm.     Pulses: Normal pulses.     Heart sounds: No murmur heard. Pulmonary:     Effort: Pulmonary effort is normal. No respiratory distress.     Breath sounds: Normal breath sounds. No wheezing.  Chest:     Chest wall: No mass.  Breasts:    Tanner Score is 5.     Right: Normal. No mass or tenderness.     Left: Normal. No mass or tenderness.  Abdominal:     General: Abdomen is flat. Bowel sounds are normal. There is no distension.     Palpations: Abdomen is soft.     Tenderness: There is no abdominal tenderness.  Genitourinary:    Rectum:  Guaiac result negative.  Musculoskeletal:        General: No swelling. Normal range of motion.     Cervical back: Full passive range of motion without pain, normal range of motion and neck supple.     Right lower leg: No edema.     Left lower leg: No edema.  Lymphadenopathy:     Upper Body:     Right upper body: No supraclavicular, axillary or pectoral adenopathy.     Left upper body: No supraclavicular, axillary or pectoral adenopathy.  Skin:    General: Skin is warm and dry.     Capillary Refill: Capillary refill takes less than 2 seconds.  Neurological:     General: No focal deficit present.     Mental Status: She is alert and oriented to person, place, and time.     Cranial Nerves: No cranial nerve deficit.     Sensory: No sensory deficit.  Psychiatric:        Mood and Affect: Mood normal.        Behavior: Behavior normal.        Thought Content: Thought content normal.        Judgment: Judgment normal.         Assessment And Plan:     Encounter for annual health examination Assessment & Plan: Behavior modifications discussed and diet history reviewed.   Pt will continue to exercise regularly and modify diet with low GI, plant based foods and decrease intake of processed foods.  Recommend intake of daily multivitamin, Vitamin D , and calcium.  Recommend mammogram and colonoscopy for preventive screenings, as well as recommend immunizations that include influenza, TDAP     Chronic hypertension Assessment & Plan: Blood pressure is controlled, continue current medications EKG done with normal sinus rhythm heart rate 90  Orders: -     EKG 12-Lead -  POCT URINALYSIS DIP (CLINITEK) -     Microalbumin / creatinine urine ratio -     CMP14+EGFR  Vitamin D  deficiency Assessment & Plan: Will check vitamin D  level and supplement as needed.    Also encouraged to spend 15 minutes in the sun daily.    Orders: -     VITAMIN D  25 Hydroxy (Vit-D Deficiency,  Fractures)  COVID-19 vaccination declined Assessment & Plan: Declines covid 19 vaccine. Discussed risk of covid 69 and if she changes her mind about the vaccine to call the office. Education has been provided regarding the importance of this vaccine but patient still declined. Advised may receive this vaccine at local pharmacy or Health Dept.or vaccine clinic. Aware to provide a copy of the vaccination record if obtained from local pharmacy or Health Dept.  Encouraged to take multivitamin, vitamin d , vitamin c and zinc to increase immune system. Aware can call office if would like to have vaccine here at office. Verbalized acceptance and understanding.    Numbness and tingling of both lower extremities Assessment & Plan: This may be related to her uncontrolled diabetes.  Will check vitamin B12 levels to see if this is the cause.  Advised as her A1c improves hopefully we can see if the numbness and tingling improves.  If not we could potentially do a EMG nerve conduction test  Orders: -     Vitamin B12  Other fatigue Assessment & Plan: Will check vitamin B12 levels.   Class 1 obesity due to excess calories with serious comorbidity and body mass index (BMI) of 34.0 to 34.9 in adult Assessment & Plan: She is encouraged to strive for BMI less than 30 to decrease cardiac risk. Advised to aim for at least 150 minutes of exercise per week.    Other long term (current) drug therapy -     CBC with Differential/Platelet  Type 2 diabetes mellitus with obesity (HCC) Assessment & Plan: Her A1c was improved slightly at her last visit at 11.2, she is advised to continue taking her current medications.  Will recheck her A1c today.  Will start her on Ozempic.  Denies family history of medullary thyroid  cancer and a personal history of pancreatitis.  Discussed side effects to include nausea, constipation.  Encouraged take a stool softener if this occurs.  And encouraged to use diaper eating if feeling  full.  Orders: -     Hemoglobin A1c -     Lipid panel -     Tresiba  FlexTouch; Inject 10 Units into the skin daily.  Dispense: 15 mL; Refill: 1 -     [place] HypoPen 2-Pack; Inject 0.5 mg into the skin as needed. Take when blood sugar less than 65  Dispense: 0.2 mL; Refill: 2 -     Semaglutide(0.25 or 0.5MG /DOS); Inject 0.5 mg into the skin once a week.  Dispense: 9 mL; Refill: 1     Return for 1 year physical, uncontrolled dm check 3 months. Patient was given opportunity to ask questions. Patient verbalized understanding of the plan and was able to repeat key elements of the plan. All questions were answered to their satisfaction.   Susanna Epley, FNP  I, Susanna Epley, FNP, have reviewed all documentation for this visit. The documentation on 09/19/23 for the exam, diagnosis, procedures, and orders are all accurate and complete.

## 2023-09-20 LAB — MICROALBUMIN / CREATININE URINE RATIO
Creatinine, Urine: 75.1 mg/dL
Microalb/Creat Ratio: 19 mg/g{creat} (ref 0–29)
Microalbumin, Urine: 14.2 ug/mL

## 2023-09-22 ENCOUNTER — Other Ambulatory Visit (HOSPITAL_BASED_OUTPATIENT_CLINIC_OR_DEPARTMENT_OTHER): Payer: Self-pay | Admitting: Certified Nurse Midwife

## 2023-09-22 DIAGNOSIS — L680 Hirsutism: Secondary | ICD-10-CM

## 2023-09-28 ENCOUNTER — Ambulatory Visit: Payer: Self-pay | Admitting: Nurse Practitioner

## 2023-09-28 DIAGNOSIS — R200 Anesthesia of skin: Secondary | ICD-10-CM | POA: Insufficient documentation

## 2023-09-28 NOTE — Assessment & Plan Note (Addendum)
 Encouraged to take supplement as needed.    Also encouraged to spend 15 minutes in the sun daily.

## 2023-09-28 NOTE — Assessment & Plan Note (Signed)

## 2023-09-28 NOTE — Assessment & Plan Note (Signed)
Will check vitamin B12 levels.

## 2023-09-28 NOTE — Assessment & Plan Note (Addendum)
 Blood pressure is controlled, continue current medications EKG done with normal sinus rhythm heart rate 90

## 2023-09-28 NOTE — Assessment & Plan Note (Signed)
 She is encouraged to strive for BMI less than 30 to decrease cardiac risk. Advised to aim for at least 150 minutes of exercise per week.

## 2023-09-28 NOTE — Assessment & Plan Note (Signed)
 This may be related to her uncontrolled diabetes.  Will check vitamin B12 levels to see if this is the cause.  Advised as her A1c improves hopefully we can see if the numbness and tingling improves.  If not we could potentially do a EMG nerve conduction test

## 2023-09-28 NOTE — Assessment & Plan Note (Addendum)
 Her A1c was improved slightly at her last visit at 11.2, she is advised to continue taking her current medications.  Will recheck her A1c today.  Will start her on Ozempic.  Denies family history of medullary thyroid  cancer and a personal history of pancreatitis.  Discussed side effects to include nausea, constipation.  Encouraged take a stool softener if this occurs.  And encouraged to use diaper eating if feeling full.

## 2023-09-28 NOTE — Assessment & Plan Note (Signed)

## 2023-11-14 ENCOUNTER — Other Ambulatory Visit: Payer: Self-pay | Admitting: Nurse Practitioner

## 2023-11-14 DIAGNOSIS — Z794 Long term (current) use of insulin: Secondary | ICD-10-CM

## 2023-11-25 ENCOUNTER — Other Ambulatory Visit: Payer: Self-pay | Admitting: Nurse Practitioner

## 2023-11-25 DIAGNOSIS — E559 Vitamin D deficiency, unspecified: Secondary | ICD-10-CM

## 2023-12-12 ENCOUNTER — Encounter: Payer: Self-pay | Admitting: Nurse Practitioner

## 2023-12-28 ENCOUNTER — Ambulatory Visit: Admitting: Nurse Practitioner

## 2023-12-28 ENCOUNTER — Encounter: Payer: Self-pay | Admitting: Nurse Practitioner

## 2023-12-28 VITALS — BP 140/70 | HR 91 | Temp 98.9°F | Ht 68.0 in | Wt 226.2 lb

## 2023-12-28 DIAGNOSIS — E6609 Other obesity due to excess calories: Secondary | ICD-10-CM

## 2023-12-28 DIAGNOSIS — Z1211 Encounter for screening for malignant neoplasm of colon: Secondary | ICD-10-CM | POA: Diagnosis not present

## 2023-12-28 DIAGNOSIS — E66811 Obesity, class 1: Secondary | ICD-10-CM

## 2023-12-28 DIAGNOSIS — E1169 Type 2 diabetes mellitus with other specified complication: Secondary | ICD-10-CM

## 2023-12-28 DIAGNOSIS — Z794 Long term (current) use of insulin: Secondary | ICD-10-CM

## 2023-12-28 DIAGNOSIS — Z6834 Body mass index (BMI) 34.0-34.9, adult: Secondary | ICD-10-CM

## 2023-12-28 DIAGNOSIS — I10 Essential (primary) hypertension: Secondary | ICD-10-CM | POA: Diagnosis not present

## 2023-12-28 DIAGNOSIS — Z2821 Immunization not carried out because of patient refusal: Secondary | ICD-10-CM | POA: Diagnosis not present

## 2023-12-28 DIAGNOSIS — E119 Type 2 diabetes mellitus without complications: Secondary | ICD-10-CM | POA: Diagnosis not present

## 2023-12-28 MED ORDER — SEMAGLUTIDE(0.25 OR 0.5MG/DOS) 2 MG/3ML ~~LOC~~ SOPN: 0.5000 mg | PEN_INJECTOR | SUBCUTANEOUS | 1 refills | Status: DC

## 2023-12-28 NOTE — Progress Notes (Signed)
 Miranda Barnett, CMA,acting as a Neurosurgeon for [name], FNP.,have documented all relevant documentation on the behalf of [name], FNP,as directed by  [name], FNP while in the presence of [name], FNP.  Subjective:  Patient ID: Miranda Barnett , female    DOB: 1975/07/07 , 48 y.o.   MRN: [id number]  Chief Complaint  Patient presents with   Diabetes    Patient presents today for a bp and dm follow up, Patient reports compliance with medication. Patient denies any chest pain, SOB, or headaches.    Tingling    Patient reports she sometimes has tingling in her feet, she notices it more when her Blood sugars are high.     HPI Discussed the use of AI scribe software for clinical note transcription with the patient, who gave verbal consent to proceed.  History of Present Illness Miranda Barnett is a 48 year old female with diabetes who presents for a follow-up visit.  She has been managing her diabetes with Ozempic  and Tresiba . She missed her Ozempic  dose yesterday but plans to take it today. She has been on Ozempic  for two weeks, using a sample. Her blood sugar levels without medication are typically around 120 mg/dL, with morning levels between 120-140 mg/dL after her last dose at dinnertime. Her highest recorded blood sugar level has been approximately 250 mg/dL. She is currently taking 20 units of Tresiba  daily, although initially prescribed 10 units. She continues to use a Dexcom for glucose monitoring.  She experiences a decrease in appetite and nausea when eating, which sometimes leads her to skip insulin  doses if she is not eating. She occasionally consumes fried foods like french fries and sweets such as ice cream, but not frequently. She has been to the eye doctor but needs to return for dilation as it was not performed during her last visit.  She was diagnosed with diabetes during her second pregnancy in 2018, which did not resolve postpartum. She was pre-diabetic after her  first pregnancy and was advised to monitor her levels and exercise regularly. Currently, she exercises about twice a week, including morning workouts and walking with her children.  She experiences tingling in her feet. She also notes swelling in her feet.    She is taking 20 units of tresiba . She is using the dexcom   Diabetes She presents for her follow-up diabetic visit. She has type 2 diabetes mellitus. Her disease course has been improving. Pertinent negatives for hypoglycemia include no confusion, dizziness, headaches or nervousness/anxiousness. Pertinent negatives for diabetes include no chest pain, no fatigue, no polydipsia, no polyphagia, no polyuria and no weakness. There are no diabetic complications. Risk factors for coronary artery disease include obesity, sedentary lifestyle and diabetes mellitus. Current diabetic treatment includes oral agent (monotherapy). Diabetic meal planning: she is not eating as much since starting Ozempic . She has not had a previous visit with a dietitian. Exercise: 2 days a week. (Blood sugar 120-140) An ACE inhibitor/angiotensin II receptor blocker is being taken. She does not see a podiatrist.Eye exam is current.     Past Medical History:  Diagnosis Date   Diabetes mellitus without complication (HCC)    Hypertension      Family History  Problem Relation Age of Onset   Diabetes Mother    Hypertension Mother      Current Outpatient Medications:    Accu-Chek FastClix Lancets MISC, USE 4 TIMES A DAY AS DIRECTED, Disp: 102 each, Rfl: 12   blood glucose meter kit and  supplies KIT, Dispense based on patient and insurance preference. Use up to four times daily as directed., Disp: 1 each, Rfl: 0   Blood Glucose Monitoring Suppl (ACCU-CHEK GUIDE) w/Device KIT, 1 Device by Does not apply route 4 (four) times daily., Disp: 1 kit, Rfl: 0   Blood Pressure Monitor KIT, 1 kit by Does not apply route once a week. CHECK BP WEEKLY.  LARGE CUFF.  DX:  Z13.6          Z34.86, Disp: 1 kit, Rfl: 0   Continuous Glucose Sensor (DEXCOM G7 SENSOR) MISC, USE DEXCOM TO CHECK BLOOD SUGARS 3 TIMES DAILY., Disp: 3 each, Rfl: 2   Eflornithine HCl 13.9 % cream, Apply 1 Application topically 2 (two) times daily with a meal. Apply topically (thin film) twice daily at least 8 hours apart, Disp: 45 g, Rfl: 1   empagliflozin  (JARDIANCE ) 10 MG TABS tablet, Take 1 tablet (10 mg total) by mouth daily before breakfast., Disp: 90 tablet, Rfl: 1   fluticasone  (FLONASE ) 50 MCG/ACT nasal spray, Place 2 sprays into both nostrils daily., Disp: 16 g, Rfl: 0   Glucagon  (GVOKE HYPOPEN  2-PACK) 0.5 MG/0.1ML SOAJ, Inject 0.5 mg into the skin as needed. Take when blood sugar less than 65, Disp: 0.2 mL, Rfl: 2   glucose blood (ACCU-CHEK GUIDE) test strip, USE TO CHECK BLOOD SUGARS FOUR TIMES A DAY *WAS INSTRUCTED*, Disp: 50 strip, Rfl: 12   hydrochlorothiazide  (HYDRODIURIL ) 25 MG tablet, Take 1 tablet (25 mg total) by mouth daily., Disp: 90 tablet, Rfl: 1   insulin  degludec (TRESIBA  FLEXTOUCH) 100 UNIT/ML FlexTouch Pen, Inject 10 Units into the skin daily., Disp: 15 mL, Rfl: 1   insulin  lispro (HUMALOG  KWIKPEN) 100 UNIT/ML KwikPen, Inject Wren insulin  before meals and at hs if BS < 150 - 0 units, 150-199 - 3 units, 200-249 - 6 units, 250-299 - 9 units, 300-349 - 12 units, >350 - 15 units. If >400 call our office with your blood sugar readings., Disp: 15 mL, Rfl: 3   ipratropium (ATROVENT ) 0.03 % nasal spray, Place 2 sprays into both nostrils every 12 (twelve) hours., Disp: 30 mL, Rfl: 0   losartan  (COZAAR ) 25 MG tablet, Take 1 tablet (25 mg total) by mouth daily., Disp: 90 tablet, Rfl: 1   Magnesium  Glycinate 100 MG CAPS, Take 2 capsules by mouth daily., Disp: 60 capsule, Rfl: 3   Vitamin D , Ergocalciferol , (DRISDOL ) 1.25 MG (50000 UNIT) CAPS capsule, TAKE 1 CAPSULE (50,000 UNITS TOTAL) BY MOUTH EVERY 7 (SEVEN) DAYS, Disp: 12 capsule, Rfl: 1   EMBECTA PEN NEEDLE NANO 2 GEN 32G X 4 MM MISC, USE AS  DIRECTED FOR INSULIN , Disp: 100 each, Rfl: 2   No Known Allergies   Review of Systems  Constitutional: Negative.  Negative for activity change and fatigue.  Eyes:  Negative for visual disturbance.  Respiratory: Negative.  Negative for choking, shortness of breath and wheezing.   Cardiovascular: Negative.  Negative for chest pain, palpitations and leg swelling.  Gastrointestinal: Negative.   Endocrine: Negative.  Negative for polydipsia, polyphagia and polyuria.  Genitourinary: Negative.   Musculoskeletal: Negative.   Skin: Negative.   Neurological:  Negative for dizziness, weakness and headaches.  Psychiatric/Behavioral:  Positive for sleep disturbance. Negative for confusion. The patient is not nervous/anxious.      Today's Vitals   12/28/23 1050  BP: (!) 140/70  Pulse: 91  Temp: 98.9 F (37.2 C)  TempSrc: Oral  Weight: 226 lb 3.2 oz (102.6 kg)  Height: 5'  8 (1.727 m)  PainSc: 0-No pain   Body mass index is 34.39 kg/m.  Wt Readings from Last 3 Encounters:  12/28/23 226 lb 3.2 oz (102.6 kg)  09/19/23 223 lb 9.6 oz (101.4 kg)  09/06/23 221 lb (100.2 kg)    Objective:  Physical Exam Vitals and nursing note reviewed.  Constitutional:      General: She is not in acute distress.    Appearance: Normal appearance. She is well-developed. She is obese.  Cardiovascular:     Rate and Rhythm: Normal rate and regular rhythm.     Pulses: Normal pulses.     Heart sounds: Normal heart sounds. No murmur heard. Pulmonary:     Effort: Pulmonary effort is normal. No respiratory distress.     Breath sounds: Normal breath sounds. No wheezing.  Musculoskeletal:        General: Normal range of motion.  Skin:    General: Skin is warm and dry.     Capillary Refill: Capillary refill takes less than 2 seconds.  Neurological:     General: No focal deficit present.     Mental Status: She is alert and oriented to person, place, and time.  Psychiatric:        Mood and Affect: Mood normal.         Behavior: Behavior normal.        Thought Content: Thought content normal.        Judgment: Judgment normal.         Assessment And Plan:  Type 2 diabetes mellitus without complication, with long-term current use of insulin  (HCC) Assessment & Plan: Type 2 diabetes with blood glucose levels ranging from 120 to 250 mg/dL. Missed Ozempic  dose. Nausea likely due to Ozempic . Paresthesia likely from elevated glucose. No diabetic retinopathy, but dilation discrepancy noted. - Administer missed Ozempic  dose today. - Obtain updated A1c level. - Continue Tresiba  20 units daily. - Provide Ozempic  0.5 mg sample, consider increasing to 1 mg based on A1c. - Discuss dilation discrepancy with eye doctor. - Encourage dietary modifications to reduce nausea, avoid fried and fatty foods. - Advise on wearing appropriate footwear to prevent injury.  Orders: -     Hemoglobin A1c -     BMP8+eGFR  Essential hypertension Assessment & Plan: Essential hypertension with possible swelling related to dietary salt intake. - Advise reducing salt intake and increasing water  consumption. - Encourage regular physical activity to manage blood pressure.  Orders: -     BMP8+eGFR  COVID-19 vaccination declined  Class 1 obesity due to excess calories with serious comorbidity and body mass index (BMI) of 34.0 to 34.9 in adult  Colon cancer screening -     Cologuard; Future  Obesity (BMI 30.0-34.9) Assessment & Plan: Class 1 obesity with BMI 34.0 to 34.9. Physical activity with daughter could be improved in frequency and intensity. - Encourage regular physical activity, at least 30 minutes a day, five days a week. - Advise on dietary modifications for weight management.     Return for uncontrolled bp 3-28months check.  Patient was given opportunity to ask questions. Patient verbalized understanding of the plan and was able to repeat key elements of the plan. All questions were answered to their  satisfaction.    Miranda [name], FNP, have reviewed all documentation for this visit. The documentation on 12/28/23 for the exam, diagnosis, procedures, and orders are all accurate and complete.   IF YOU HAVE BEEN REFERRED TO A SPECIALIST, IT MAY TAKE 1-2  WEEKS TO SCHEDULE/PROCESS THE REFERRAL. IF YOU HAVE NOT HEARD FROM US /SPECIALIST IN TWO WEEKS, PLEASE GIVE US  A CALL AT [phone] X [phone].

## 2023-12-29 LAB — BMP8+EGFR
BUN/Creatinine Ratio: 13 (ref 9–23)
BUN: 10 mg/dL (ref 6–24)
CO2: 21 mmol/L (ref 20–29)
Calcium: 9.8 mg/dL (ref 8.7–10.2)
Chloride: 101 mmol/L (ref 96–106)
Creatinine, Ser: 0.79 mg/dL (ref 0.57–1.00)
Glucose: 115 mg/dL — ABNORMAL HIGH (ref 70–99)
Potassium: 3.8 mmol/L (ref 3.5–5.2)
Sodium: 139 mmol/L (ref 134–144)
eGFR: 93 mL/min/1.73 (ref >59)

## 2023-12-29 LAB — HEMOGLOBIN A1C
Est. average glucose Bld gHb Est-mCnc: 160 mg/dL
Hgb A1c MFr Bld: 7.2 % — ABNORMAL HIGH (ref 4.8–5.6)

## 2024-01-03 ENCOUNTER — Other Ambulatory Visit: Payer: Self-pay | Admitting: Nurse Practitioner

## 2024-01-10 ENCOUNTER — Ambulatory Visit: Payer: Self-pay | Admitting: Nurse Practitioner

## 2024-01-10 DIAGNOSIS — E66811 Obesity, class 1: Secondary | ICD-10-CM | POA: Insufficient documentation

## 2024-01-10 DIAGNOSIS — Z1211 Encounter for screening for malignant neoplasm of colon: Secondary | ICD-10-CM | POA: Insufficient documentation

## 2024-01-10 NOTE — Assessment & Plan Note (Signed)
 Type 2 diabetes with blood glucose levels ranging from 120 to 250 mg/dL. Missed Ozempic  dose. Nausea likely due to Ozempic . Paresthesia likely from elevated glucose. No diabetic retinopathy, but dilation discrepancy noted. - Administer missed Ozempic  dose today. - Obtain updated A1c level. - Continue Tresiba  20 units daily. - Provide Ozempic  0.5 mg sample, consider increasing to 1 mg based on A1c. - Discuss dilation discrepancy with eye doctor. - Encourage dietary modifications to reduce nausea, avoid fried and fatty foods. - Advise on wearing appropriate footwear to prevent injury.

## 2024-01-10 NOTE — Assessment & Plan Note (Signed)
 Essential hypertension with possible swelling related to dietary salt intake. - Advise reducing salt intake and increasing water  consumption. - Encourage regular physical activity to manage blood pressure.

## 2024-01-10 NOTE — Assessment & Plan Note (Signed)
 Class 1 obesity with BMI 34.0 to 34.9. Physical activity with daughter could be improved in frequency and intensity. - Encourage regular physical activity, at least 30 minutes a day, five days a week. - Advise on dietary modifications for weight management.

## 2024-01-13 ENCOUNTER — Other Ambulatory Visit: Payer: Self-pay | Admitting: Nurse Practitioner

## 2024-01-13 DIAGNOSIS — I10 Essential (primary) hypertension: Secondary | ICD-10-CM

## 2024-01-13 DIAGNOSIS — Z794 Long term (current) use of insulin: Secondary | ICD-10-CM

## 2024-01-23 ENCOUNTER — Other Ambulatory Visit: Payer: Self-pay | Admitting: Nurse Practitioner

## 2024-01-23 ENCOUNTER — Telehealth: Payer: Self-pay | Admitting: Nurse Practitioner

## 2024-01-23 DIAGNOSIS — E1169 Type 2 diabetes mellitus with other specified complication: Secondary | ICD-10-CM

## 2024-01-23 DIAGNOSIS — E119 Type 2 diabetes mellitus without complications: Secondary | ICD-10-CM

## 2024-01-23 MED ORDER — TRESIBA FLEXTOUCH 100 UNIT/ML ~~LOC~~ SOPN: 30.0000 [IU] | PEN_INJECTOR | Freq: Two times a day (BID) | SUBCUTANEOUS | 1 refills | Status: AC

## 2024-01-23 MED ORDER — SEMAGLUTIDE(0.25 OR 0.5MG/DOS) 2 MG/3ML ~~LOC~~ SOPN: 0.5000 mg | PEN_INJECTOR | SUBCUTANEOUS | 1 refills | Status: DC

## 2024-01-23 NOTE — Telephone Encounter (Signed)
 Called to confirm with patient the amount of Tresiba  she is taking. She verbalizes that she has been taking Tresiba  30 units twice a day due to increase blood sugars mostly at night. She has just started on Ozempic  0.25 mg will try to get her approved for Ozempic . Will keep current rx until we can get her blood sugar down. She is also advised to take the jardiance  daily in the am, she has not been taking regularly.

## 2024-02-01 ENCOUNTER — Telehealth: Payer: Self-pay

## 2024-02-01 NOTE — Telephone Encounter (Signed)
 PA for ozempic sent to plan.

## 2024-02-01 NOTE — Telephone Encounter (Signed)
RX benefits verified.

## 2024-03-19 ENCOUNTER — Other Ambulatory Visit: Payer: Self-pay | Admitting: Nurse Practitioner

## 2024-03-19 DIAGNOSIS — E119 Type 2 diabetes mellitus without complications: Secondary | ICD-10-CM

## 2024-04-03 ENCOUNTER — Ambulatory Visit: Payer: Self-pay | Admitting: Nurse Practitioner

## 2024-04-03 ENCOUNTER — Telehealth: Admitting: Family Medicine

## 2024-04-03 DIAGNOSIS — R051 Acute cough: Secondary | ICD-10-CM

## 2024-04-03 DIAGNOSIS — B9689 Other specified bacterial agents as the cause of diseases classified elsewhere: Secondary | ICD-10-CM | POA: Diagnosis not present

## 2024-04-03 DIAGNOSIS — J019 Acute sinusitis, unspecified: Secondary | ICD-10-CM

## 2024-04-03 MED ORDER — PROMETHAZINE-DM 6.25-15 MG/5ML PO SYRP: 5.0000 mL | ORAL_SOLUTION | Freq: Four times a day (QID) | ORAL | 0 refills | Status: AC | PRN

## 2024-04-03 MED ORDER — BENZONATATE 200 MG PO CAPS: 200.0000 mg | ORAL_CAPSULE | Freq: Three times a day (TID) | ORAL | 0 refills | Status: AC | PRN

## 2024-04-03 MED ORDER — ALBUTEROL SULFATE HFA 108 (90 BASE) MCG/ACT IN AERS: 1.0000 | INHALATION_SPRAY | RESPIRATORY_TRACT | 0 refills | Status: AC | PRN

## 2024-04-03 MED ORDER — AMOXICILLIN-POT CLAVULANATE 875-125 MG PO TABS: 1.0000 | ORAL_TABLET | Freq: Two times a day (BID) | ORAL | 0 refills | Status: AC

## 2024-04-03 NOTE — Progress Notes (Deleted)
 Miranda Barnett, CMA,acting as a neurosurgeon for [name], FNP.,have documented all relevant documentation on the behalf of [name], FNP,as directed by  [name], FNP while in the presence of [name], FNP.  Subjective:  Patient ID: Miranda Barnett , female    DOB: [birth year] , 48 y.o.   MRN: [id number]  No chief complaint on file.   HPI  HPI   Past Medical History:  Diagnosis Date   Diabetes mellitus without complication (HCC)    Hypertension      Family History  Problem Relation Age of Onset   Diabetes Mother    Hypertension Mother      Current Outpatient Medications:    Accu-Chek FastClix Lancets MISC, USE 4 TIMES A DAY AS DIRECTED, Disp: 102 each, Rfl: 12   blood glucose meter kit and supplies KIT, Dispense based on patient and insurance preference. Use up to four times daily as directed., Disp: 1 each, Rfl: 0   Blood Glucose Monitoring Suppl (ACCU-CHEK GUIDE) w/Device KIT, 1 Device by Does not apply route 4 (four) times daily., Disp: 1 kit, Rfl: 0   Blood Pressure Monitor KIT, 1 kit by Does not apply route once a week. CHECK BP WEEKLY.  LARGE CUFF.  DX:  Z13.6         Z34.86, Disp: 1 kit, Rfl: 0   Continuous Glucose Sensor (DEXCOM G7 SENSOR) MISC, USE DEXCOM TO CHECK BLOOD SUGARS 3 TIMES DAILY., Disp: 3 each, Rfl: 2   Eflornithine HCl 13.9 % cream, Apply 1 Application topically 2 (two) times daily with a meal. Apply topically (thin film) twice daily at least 8 hours apart, Disp: 45 g, Rfl: 1   EMBECTA PEN NEEDLE NANO 2 GEN 32G X 4 MM MISC, USE AS DIRECTED FOR INSULIN , Disp: 100 each, Rfl: 2   empagliflozin  (JARDIANCE ) 10 MG TABS tablet, Take 1 tablet (10 mg total) by mouth daily before breakfast., Disp: 90 tablet, Rfl: 1   fluticasone  (FLONASE ) 50 MCG/ACT nasal spray, Place 2 sprays into both nostrils daily., Disp: 16 g, Rfl: 0   Glucagon  (GVOKE HYPOPEN  2-PACK) 0.5 MG/0.1ML SOAJ, Inject 0.5 mg into the skin as needed. Take when blood sugar less than 65, Disp: 0.2 mL,  Rfl: 2   glucose blood (ACCU-CHEK GUIDE) test strip, USE TO CHECK BLOOD SUGARS FOUR TIMES A DAY *WAS INSTRUCTED*, Disp: 50 strip, Rfl: 12   hydrochlorothiazide  (HYDRODIURIL ) 25 MG tablet, TAKE 1 TABLET (25 MG TOTAL) BY MOUTH DAILY., Disp: 90 tablet, Rfl: 1   insulin  degludec (TRESIBA  FLEXTOUCH) 100 UNIT/ML FlexTouch Pen, Inject 30 Units into the skin 2 (two) times daily., Disp: 15 mL, Rfl: 1   insulin  lispro (HUMALOG  KWIKPEN) 100 UNIT/ML KwikPen, Inject Leon insulin  before meals and at hs if BS < 150 - 0 units, 150-199 - 3 units, 200-249 - 6 units, 250-299 - 9 units, 300-349 - 12 units, >350 - 15 units. If >400 call our office with your blood sugar readings., Disp: 15 mL, Rfl: 3   ipratropium (ATROVENT ) 0.03 % nasal spray, Place 2 sprays into both nostrils every 12 (twelve) hours., Disp: 30 mL, Rfl: 0   losartan  (COZAAR ) 25 MG tablet, TAKE 1 TABLET (25 MG TOTAL) BY MOUTH DAILY., Disp: 90 tablet, Rfl: 1   Magnesium  Glycinate 100 MG CAPS, Take 2 capsules by mouth daily., Disp: 60 capsule, Rfl: 3   Semaglutide ,0.25 or 0.5MG /DOS, 2 MG/3ML SOPN, Inject 0.5 mg into the skin once a week., Disp: 9 mL, Rfl: 1  Vitamin D , Ergocalciferol , (DRISDOL ) 1.25 MG (50000 UNIT) CAPS capsule, TAKE 1 CAPSULE (50,000 UNITS TOTAL) BY MOUTH EVERY 7 (SEVEN) DAYS, Disp: 12 capsule, Rfl: 1   No Known Allergies   Review of Systems   There were no vitals filed for this visit. There is no height or weight on file to calculate BMI.  Wt Readings from Last 3 Encounters:  12/28/23 226 lb 3.2 oz (102.6 kg)  09/19/23 223 lb 9.6 oz (101.4 kg)  09/06/23 221 lb (100.2 kg)    The ASCVD Risk score (Arnett DK, et al., 2019) failed to calculate for the following reasons:   Cannot find a previous HDL lab   Cannot find a previous total cholesterol lab  Objective:  Physical Exam      Assessment And Plan:   Assessment & Plan Chronic hypertension  Type 2 diabetes mellitus without complication, with long-term current use of insulin   (HCC)   No orders of the defined types were placed in this encounter.    No follow-ups on file.  Patient was given opportunity to ask questions. Patient verbalized understanding of the plan and was able to repeat key elements of the plan. All questions were answered to their satisfaction.    Miranda [name], FNP, have reviewed all documentation for this visit. The documentation on 04/03/24 for the exam, diagnosis, procedures, and orders are all accurate and complete.   IF YOU HAVE BEEN REFERRED TO A SPECIALIST, IT MAY TAKE 1-2 WEEKS TO SCHEDULE/PROCESS THE REFERRAL. IF YOU HAVE NOT HEARD FROM US /SPECIALIST IN TWO WEEKS, PLEASE GIVE US  A CALL AT [phone].

## 2024-04-03 NOTE — Progress Notes (Signed)
 Virtual Visit Consent   Miranda Barnett, you are scheduled for a virtual visit with a [facility] provider today. Just as with appointments in the office, your consent must be obtained to participate. Your consent will be active for this visit and any virtual visit you may have with one of our providers in the next 365 days. If you have a MyChart account, a copy of this consent can be sent to you electronically.  As this is a virtual visit, video technology does not allow for your provider to perform a traditional examination. This may limit your provider's ability to fully assess your condition. If your provider identifies any concerns that need to be evaluated in person or the need to arrange testing (such as labs, EKG, etc.), we will make arrangements to do so. Although advances in technology are sophisticated, we cannot ensure that it will always work on either your end or our end. If the connection with a video visit is poor, the visit may have to be switched to a telephone visit. With either a video or telephone visit, we are not always able to ensure that we have a secure connection.  By engaging in this virtual visit, you consent to the provision of healthcare and authorize for your insurance to be billed (if applicable) for the services provided during this visit. Depending on your insurance coverage, you may receive a charge related to this service.  I need to obtain your verbal consent now. Are you willing to proceed with your visit today? Miranda Barnett has provided verbal consent on 04/03/2024 for a virtual visit (video or telephone). Miranda CHRISTELLA Barefoot, NP  Date: 04/03/2024 2:09 PM   Virtual Visit via Video Note   I, Miranda Barnett, connected with  Miranda Barnett  ([id number], December 13, 1975) on 04/03/24 at  2:00 PM EST by a video-enabled telemedicine application and verified that I am speaking with the correct person using two identifiers.  Location: Patient: Virtual Visit Location Patient:  Home Provider: Virtual Visit Location Provider: Home Office   I discussed the limitations of evaluation and management by telemedicine and the availability of in person appointments. The patient expressed understanding and agreed to proceed.    History of Present Illness: Miranda Barnett is a 48 y.o. who identifies as a female who was assigned female at birth, and is being seen today for sinus infection  Onset was 3 weeks ago with runny nose, congestion and mild cough Associated symptoms are having ear pain- on the left, cough with green mucus now from cough at times. The nasal congestion is improving slowly. Did vomit last night due to coughing so hard. Winded when coughing or walking. Headache and sore throat Modifying factors are Vicks with honey, Robitussin, water  Denies chest pain, shortness of breath, fevers, chills   Exposure to sick contacts-known son is sick COVID test:   Problems:  Patient Active Problem List   Diagnosis Date Noted   Class 1 obesity due to excess calories with serious comorbidity and body mass index (BMI) of 34.0 to 34.9 in adult 01/10/2024   Colon cancer screening 01/10/2024   Numbness and tingling of both lower extremities 09/28/2023   Encounter for annual health examination 09/19/2023   Type 2 diabetes mellitus without complication, with long-term current use of insulin  (HCC) 09/19/2023   Vitamin D  deficiency 09/19/2023   Other fatigue 09/19/2023   Hirsutism 09/06/2023   Menorrhagia with irregular cycle 09/06/2023   Menorrhagia with regular cycle 07/14/2023   Dysmenorrhea 07/14/2023  Establishing care with new doctor, encounter for 06/21/2023   COVID-19 vaccination declined 06/21/2023   Screening for colon cancer 06/21/2023   Obesity (BMI 30.0-34.9) 06/21/2023   Moderate episode of recurrent major depressive disorder (HCC) 06/21/2023   Encounter for screening mammogram for breast cancer 06/21/2023   Irregular menstrual cycle 07/02/2020   Chronic  hypertension 01/24/2019   History of 2 cesarean sections 11/21/2018    Allergies: No Known Allergies Medications:  Current Outpatient Medications:    Accu-Chek FastClix Lancets MISC, USE 4 TIMES A DAY AS DIRECTED, Disp: 102 each, Rfl: 12   blood glucose meter kit and supplies KIT, Dispense based on patient and insurance preference. Use up to four times daily as directed., Disp: 1 each, Rfl: 0   Blood Glucose Monitoring Suppl (ACCU-CHEK GUIDE) w/Device KIT, 1 Device by Does not apply route 4 (four) times daily., Disp: 1 kit, Rfl: 0   Blood Pressure Monitor KIT, 1 kit by Does not apply route once a week. CHECK BP WEEKLY.  LARGE CUFF.  DX:  Z13.6         Z34.86, Disp: 1 kit, Rfl: 0   Continuous Glucose Sensor (DEXCOM G7 SENSOR) MISC, USE DEXCOM TO CHECK BLOOD SUGARS 3 TIMES DAILY., Disp: 3 each, Rfl: 2   Eflornithine HCl 13.9 % cream, Apply 1 Application topically 2 (two) times daily with a meal. Apply topically (thin film) twice daily at least 8 hours apart, Disp: 45 g, Rfl: 1   EMBECTA PEN NEEDLE NANO 2 GEN 32G X 4 MM MISC, USE AS DIRECTED FOR INSULIN , Disp: 100 each, Rfl: 2   empagliflozin  (JARDIANCE ) 10 MG TABS tablet, Take 1 tablet (10 mg total) by mouth daily before breakfast., Disp: 90 tablet, Rfl: 1   fluticasone  (FLONASE ) 50 MCG/ACT nasal spray, Place 2 sprays into both nostrils daily., Disp: 16 g, Rfl: 0   Glucagon  (GVOKE HYPOPEN  2-PACK) 0.5 MG/0.1ML SOAJ, Inject 0.5 mg into the skin as needed. Take when blood sugar less than 65, Disp: 0.2 mL, Rfl: 2   glucose blood (ACCU-CHEK GUIDE) test strip, USE TO CHECK BLOOD SUGARS FOUR TIMES A DAY *WAS INSTRUCTED*, Disp: 50 strip, Rfl: 12   hydrochlorothiazide  (HYDRODIURIL ) 25 MG tablet, TAKE 1 TABLET (25 MG TOTAL) BY MOUTH DAILY., Disp: 90 tablet, Rfl: 1   insulin  degludec (TRESIBA  FLEXTOUCH) 100 UNIT/ML FlexTouch Pen, Inject 30 Units into the skin 2 (two) times daily., Disp: 15 mL, Rfl: 1   insulin  lispro (HUMALOG  KWIKPEN) 100 UNIT/ML KwikPen,  Inject [place] insulin  before meals and at hs if BS < 150 - 0 units, 150-199 - 3 units, 200-249 - 6 units, 250-299 - 9 units, 300-349 - 12 units, >350 - 15 units. If >400 call our office with your blood sugar readings., Disp: 15 mL, Rfl: 3   ipratropium (ATROVENT ) 0.03 % nasal spray, Place 2 sprays into both nostrils every 12 (twelve) hours., Disp: 30 mL, Rfl: 0   losartan  (COZAAR ) 25 MG tablet, TAKE 1 TABLET (25 MG TOTAL) BY MOUTH DAILY., Disp: 90 tablet, Rfl: 1   Magnesium  Glycinate 100 MG CAPS, Take 2 capsules by mouth daily., Disp: 60 capsule, Rfl: 3   Semaglutide ,0.25 or 0.5MG /DOS, 2 MG/3ML SOPN, Inject 0.5 mg into the skin once a week., Disp: 9 mL, Rfl: 1   Vitamin D , Ergocalciferol , (DRISDOL ) 1.25 MG (50000 UNIT) CAPS capsule, TAKE 1 CAPSULE (50,000 UNITS TOTAL) BY MOUTH EVERY 7 (SEVEN) DAYS, Disp: 12 capsule, Rfl: 1  Observations/Objective: Patient is well-developed, well-nourished in no acute distress.  Resting comfortably  at home.  Head is normocephalic, atraumatic.  No labored breathing.  Speech is clear and coherent with logical content.  Patient is alert and oriented at baseline.  Cough  Assessment and Plan:   1. Acute bacterial sinusitis (Primary)  - amoxicillin-clavulanate (AUGMENTIN) 875-125 MG tablet; Take 1 tablet by mouth 2 (two) times daily for 7 days.  Dispense: 14 tablet; Refill: 0 - benzonatate  (TESSALON ) 200 MG capsule; Take 1 capsule (200 mg total) by mouth 3 (three) times daily as needed for cough.  Dispense: 30 capsule; Refill: 0 - promethazine -dextromethorphan (PROMETHAZINE -DM) 6.25-15 MG/5ML syrup; Take 5 mLs by mouth 4 (four) times daily as needed for cough.  Dispense: 118 mL; Refill: 0  2. Acute cough  - benzonatate  (TESSALON ) 200 MG capsule; Take 1 capsule (200 mg total) by mouth 3 (three) times daily as needed for cough.  Dispense: 30 capsule; Refill: 0 - promethazine -dextromethorphan (PROMETHAZINE -DM) 6.25-15 MG/5ML syrup; Take 5 mLs by mouth 4 (four) times  daily as needed for cough.  Dispense: 118 mL; Refill: 0 - albuterol (VENTOLIN HFA) 108 (90 Base) MCG/ACT inhaler; Inhale 1-2 puffs into the lungs every 4 (four) hours as needed for wheezing or shortness of breath (cough).  Dispense: 8 g; Refill: 0  URI recommendations: - Increased rest - Increasing Fluids - Acetaminophen  / ibuprofen  as needed for fever/pain.  - Salt water  gargling, chloraseptic spray and throat lozenges - Mucinex if mucus is present and increasing.  - Saline nasal spray if congestion or if nasal passages feel dry. - Humidifying the air.    Reviewed side effects, risks and benefits of medication.    Patient acknowledged agreement and understanding of the plan.   Past Medical, Surgical, Social History, Allergies, and Medications have been Reviewed.    Follow Up Instructions: I discussed the assessment and treatment plan with the patient. The patient was provided an opportunity to ask questions and all were answered. The patient agreed with the plan and demonstrated an understanding of the instructions.  A copy of instructions were sent to the patient via MyChart unless otherwise noted below.    The patient was advised to call back or seek an in-person evaluation if the symptoms worsen or if the condition fails to improve as anticipated.    Miranda CHRISTELLA Barefoot, NP

## 2024-04-03 NOTE — Patient Instructions (Signed)
 Miranda Barnett, thank you for joining [name], NP for today's virtual visit.  While this provider is not your primary care provider (PCP), if your PCP is located in our provider database this encounter information will be shared with them immediately following your visit.   A [facility] MyChart account gives you access to today's visit and all your visits, tests, and labs performed at [facility]  click here if you don't have a [facility] MyChart account or go to [email or web address]  Consent: (Patient) Miranda Barnett provided verbal consent for this virtual visit at the beginning of the encounter.  Current Medications:  Current Outpatient Medications:    albuterol  (VENTOLIN  HFA) 108 (90 Base) MCG/ACT inhaler, Inhale 1-2 puffs into the lungs every 4 (four) hours as needed for wheezing or shortness of breath (cough)., Disp: 8 g, Rfl: 0   amoxicillin -clavulanate (AUGMENTIN ) 875-125 MG tablet, Take 1 tablet by mouth 2 (two) times daily for 7 days., Disp: 14 tablet, Rfl: 0   benzonatate  (TESSALON ) 200 MG capsule, Take 1 capsule (200 mg total) by mouth 3 (three) times daily as needed for cough., Disp: 30 capsule, Rfl: 0   promethazine -dextromethorphan (PROMETHAZINE -DM) 6.25-15 MG/5ML syrup, Take 5 mLs by mouth 4 (four) times daily as needed for cough., Disp: 118 mL, Rfl: 0   Accu-Chek FastClix Lancets MISC, USE 4 TIMES A DAY AS DIRECTED, Disp: 102 each, Rfl: 12   blood glucose meter kit and supplies KIT, Dispense based on patient and insurance preference. Use up to four times daily as directed., Disp: 1 each, Rfl: 0   Blood Glucose Monitoring Suppl (ACCU-CHEK GUIDE) w/Device KIT, 1 Device by Does not apply route 4 (four) times daily., Disp: 1 kit, Rfl: 0   Blood Pressure Monitor KIT, 1 kit by Does not apply route once a week. CHECK BP WEEKLY.  LARGE CUFF.  DX:  Z13.6         Z34.86, Disp: 1 kit, Rfl: 0   Continuous Glucose Sensor (DEXCOM G7 SENSOR) MISC, USE DEXCOM TO CHECK  BLOOD SUGARS 3 TIMES DAILY., Disp: 3 each, Rfl: 2   Eflornithine HCl 13.9 % cream, Apply 1 Application topically 2 (two) times daily with a meal. Apply topically (thin film) twice daily at least 8 hours apart, Disp: 45 g, Rfl: 1   EMBECTA PEN NEEDLE NANO 2 GEN 32G X 4 MM MISC, USE AS DIRECTED FOR INSULIN , Disp: 100 each, Rfl: 2   empagliflozin  (JARDIANCE ) 10 MG TABS tablet, Take 1 tablet (10 mg total) by mouth daily before breakfast., Disp: 90 tablet, Rfl: 1   fluticasone  (FLONASE ) 50 MCG/ACT nasal spray, Place 2 sprays into both nostrils daily., Disp: 16 g, Rfl: 0   Glucagon  (GVOKE HYPOPEN  2-PACK) 0.5 MG/0.1ML SOAJ, Inject 0.5 mg into the skin as needed. Take when blood sugar less than 65, Disp: 0.2 mL, Rfl: 2   glucose blood (ACCU-CHEK GUIDE) test strip, USE TO CHECK BLOOD SUGARS FOUR TIMES A DAY *WAS INSTRUCTED*, Disp: 50 strip, Rfl: 12   hydrochlorothiazide  (HYDRODIURIL ) 25 MG tablet, TAKE 1 TABLET (25 MG TOTAL) BY MOUTH DAILY., Disp: 90 tablet, Rfl: 1   insulin  degludec (TRESIBA  FLEXTOUCH) 100 UNIT/ML FlexTouch Pen, Inject 30 Units into the skin 2 (two) times daily., Disp: 15 mL, Rfl: 1   insulin  lispro (HUMALOG  KWIKPEN) 100 UNIT/ML KwikPen, Inject [place] insulin  before meals and at hs if BS < 150 - 0 units, 150-199 - 3 units, 200-249 - 6 units, 250-299 - 9 units, 300-349 - 12 units, >  350 - 15 units. If >400 call our office with your blood sugar readings., Disp: 15 mL, Rfl: 3   ipratropium (ATROVENT ) 0.03 % nasal spray, Place 2 sprays into both nostrils every 12 (twelve) hours., Disp: 30 mL, Rfl: 0   losartan  (COZAAR ) 25 MG tablet, TAKE 1 TABLET (25 MG TOTAL) BY MOUTH DAILY., Disp: 90 tablet, Rfl: 1   Magnesium  Glycinate 100 MG CAPS, Take 2 capsules by mouth daily., Disp: 60 capsule, Rfl: 3   Semaglutide ,0.25 or 0.5MG /DOS, 2 MG/3ML SOPN, Inject 0.5 mg into the skin once a week., Disp: 9 mL, Rfl: 1   Vitamin D , Ergocalciferol , (DRISDOL ) 1.25 MG (50000 UNIT) CAPS capsule, TAKE 1 CAPSULE (50,000 UNITS  TOTAL) BY MOUTH EVERY 7 (SEVEN) DAYS, Disp: 12 capsule, Rfl: 1   Medications ordered in this encounter:  Meds ordered this encounter  Medications   amoxicillin-clavulanate (AUGMENTIN) 875-125 MG tablet    Sig: Take 1 tablet by mouth 2 (two) times daily for 7 days.    Dispense:  14 tablet    Refill:  0    Supervising Provider:   LAMPTEY, PHILIP O [[id number]]   benzonatate  (TESSALON ) 200 MG capsule    Sig: Take 1 capsule (200 mg total) by mouth 3 (three) times daily as needed for cough.    Dispense:  30 capsule    Refill:  0    Supervising Provider:   LAMPTEY, PHILIP O [[id number]]   promethazine -dextromethorphan (PROMETHAZINE -DM) 6.25-15 MG/5ML syrup    Sig: Take 5 mLs by mouth 4 (four) times daily as needed for cough.    Dispense:  118 mL    Refill:  0    Supervising Provider:   BLAISE ALEENE KIDD [[id number]]   albuterol (VENTOLIN HFA) 108 (90 Base) MCG/ACT inhaler    Sig: Inhale 1-2 puffs into the lungs every 4 (four) hours as needed for wheezing or shortness of breath (cough).    Dispense:  8 g    Refill:  0    Supervising Provider:   BLAISE ALEENE KIDD [[id number]]     *If you need refills on other medications prior to your next appointment, please contact your pharmacy*  Follow-Up: Call back or seek an in-person evaluation if the symptoms worsen or if the condition fails to improve as anticipated.  [facility] [phone]  Other Instructions URI recommendations: - Increased rest - Increasing Fluids - Acetaminophen  / ibuprofen  as needed for fever/pain.  - Salt water  gargling, chloraseptic spray and throat lozenges - Mucinex if mucus is present and increasing.  - Saline nasal spray if congestion or if nasal passages feel dry. - Humidifying the air.     If you have been instructed to have an in-person evaluation today at a local Urgent Care facility, please use the link below. It will take you to a list of all of our available [facility], including  address, phone number and hours of operation. Please do not delay care.  [facility]  If you or a family member do not have a primary care provider, use the link below to schedule a visit and establish care. When you choose a [facility] primary care physician or advanced practice provider, you gain a long-term partner in health. Find a Primary Care Provider  Learn more about [facility]'s in-office and virtual care options: [facility] - Get Care Now

## 2024-05-06 ENCOUNTER — Telehealth: Payer: Self-pay

## 2024-05-06 NOTE — Telephone Encounter (Signed)
 I called patient to see if she was able to do her appointment today virtually instead of Thursday. Patient declined and stated she is unable to and rescheduled her original appt to next week. Patient also advised me that she hasn't been able to get her Dexcom G7 and that it needed a PA. Her PA has been processed through [facility]. YL,RMA

## 2024-05-09 ENCOUNTER — Ambulatory Visit: Payer: Self-pay | Admitting: Nurse Practitioner

## 2024-05-15 NOTE — Progress Notes (Signed)
 LILLETTE Kristeen JINNY Gladis, CMA,acting as a neurosurgeon for Miranda Ada, FNP.,have documented all relevant documentation on the behalf of Miranda Ada, FNP,as directed by  Miranda Ada, FNP while in the presence of Miranda Ada, FNP.  Subjective:  Patient ID: Miranda Barnett , female    DOB: 1975/09/25 , 49 y.o.   MRN: [id number]  Chief Complaint  Patient presents with   Hypertension    Patient presents today for a bp and dm follow up, Patient reports compliance with medication. Patient denies any chest pain, SOB, or headaches. Patient has no concerns today.     HPI  HPI   Past Medical History:  Diagnosis Date   Diabetes mellitus without complication (HCC)    Hypertension      Family History  Problem Relation Age of Onset   Diabetes Mother    Hypertension Mother     Current Medications[1]   Allergies[2]   Review of Systems   There were no vitals filed for this visit. There is no height or weight on file to calculate BMI.  Wt Readings from Last 3 Encounters:  12/28/23 226 lb 3.2 oz (102.6 kg)  09/19/23 223 lb 9.6 oz (101.4 kg)  09/06/23 221 lb (100.2 kg)    The ASCVD Risk score (Arnett DK, et al., 2019) failed to calculate for the following reasons:   Cannot find a previous HDL lab   Cannot find a previous total cholesterol lab   * - Cholesterol units were assumed  Objective:  Physical Exam      Assessment And Plan:   Assessment & Plan Type 2 diabetes mellitus without complication, with long-term current use of insulin  (HCC)  Chronic hypertension   No orders of the defined types were placed in this encounter.    Return for keep same next.  Patient was given opportunity to ask questions. Patient verbalized understanding of the plan and was able to repeat key elements of the plan. All questions were answered to their satisfaction.    LILLETTE Miranda Ada, FNP, have reviewed all documentation for this visit. The documentation on 05/16/24 for the exam, diagnosis, procedures, and  orders are all accurate and complete.   IF YOU HAVE BEEN REFERRED TO A SPECIALIST, IT MAY TAKE 1-2 WEEKS TO SCHEDULE/PROCESS THE REFERRAL. IF YOU HAVE NOT HEARD FROM US /SPECIALIST IN TWO WEEKS, PLEASE GIVE US  A CALL AT [phone].      [1]  Current Outpatient Medications:    Accu-Chek FastClix Lancets MISC, USE 4 TIMES A DAY AS DIRECTED, Disp: 102 each, Rfl: 12   albuterol  (VENTOLIN  HFA) 108 (90 Base) MCG/ACT inhaler, Inhale 1-2 puffs into the lungs every 4 (four) hours as needed for wheezing or shortness of breath (cough)., Disp: 8 g, Rfl: 0   benzonatate  (TESSALON ) 200 MG capsule, Take 1 capsule (200 mg total) by mouth 3 (three) times daily as needed for cough., Disp: 30 capsule, Rfl: 0   blood glucose meter kit and supplies KIT, Dispense based on patient and insurance preference. Use up to four times daily as directed., Disp: 1 each, Rfl: 0   Blood Glucose Monitoring Suppl (ACCU-CHEK GUIDE) w/Device KIT, 1 Device by Does not apply route 4 (four) times daily., Disp: 1 kit, Rfl: 0   Blood Pressure Monitor KIT, 1 kit by Does not apply route once a week. CHECK BP WEEKLY.  LARGE CUFF.  DX:  Z13.6         Z34.86, Disp: 1 kit, Rfl: 0   Continuous Glucose Sensor (  DEXCOM G7 SENSOR) MISC, USE DEXCOM TO CHECK BLOOD SUGARS 3 TIMES DAILY., Disp: 3 each, Rfl: 2   Eflornithine HCl 13.9 % cream, Apply 1 Application topically 2 (two) times daily with a meal. Apply topically (thin film) twice daily at least 8 hours apart, Disp: 45 g, Rfl: 1   EMBECTA PEN NEEDLE NANO 2 GEN 32G X 4 MM MISC, USE AS DIRECTED FOR INSULIN , Disp: 100 each, Rfl: 2   empagliflozin  (JARDIANCE ) 10 MG TABS tablet, Take 1 tablet (10 mg total) by mouth daily before breakfast., Disp: 90 tablet, Rfl: 1   fluticasone  (FLONASE ) 50 MCG/ACT nasal spray, Place 2 sprays into both nostrils daily., Disp: 16 g, Rfl: 0   Glucagon  (GVOKE HYPOPEN  2-PACK) 0.5 MG/0.1ML SOAJ, Inject 0.5 mg into the skin as needed. Take when blood sugar less than 65,  Disp: 0.2 mL, Rfl: 2   glucose blood (ACCU-CHEK GUIDE) test strip, USE TO CHECK BLOOD SUGARS FOUR TIMES A DAY *WAS INSTRUCTED*, Disp: 50 strip, Rfl: 12   hydrochlorothiazide  (HYDRODIURIL ) 25 MG tablet, TAKE 1 TABLET (25 MG TOTAL) BY MOUTH DAILY., Disp: 90 tablet, Rfl: 1   insulin  degludec (TRESIBA  FLEXTOUCH) 100 UNIT/ML FlexTouch Pen, Inject 30 Units into the skin 2 (two) times daily., Disp: 15 mL, Rfl: 1   insulin  lispro (HUMALOG  KWIKPEN) 100 UNIT/ML KwikPen, Inject [place] insulin  before meals and at hs if BS < 150 - 0 units, 150-199 - 3 units, 200-249 - 6 units, 250-299 - 9 units, 300-349 - 12 units, >350 - 15 units. If >400 call our office with your blood sugar readings., Disp: 15 mL, Rfl: 3   ipratropium (ATROVENT ) 0.03 % nasal spray, Place 2 sprays into both nostrils every 12 (twelve) hours., Disp: 30 mL, Rfl: 0   losartan  (COZAAR ) 25 MG tablet, TAKE 1 TABLET (25 MG TOTAL) BY MOUTH DAILY., Disp: 90 tablet, Rfl: 1   Magnesium  Glycinate 100 MG CAPS, Take 2 capsules by mouth daily., Disp: 60 capsule, Rfl: 3   promethazine -dextromethorphan (PROMETHAZINE -DM) 6.25-15 MG/5ML syrup, Take 5 mLs by mouth 4 (four) times daily as needed for cough., Disp: 118 mL, Rfl: 0   Semaglutide ,0.25 or 0.5MG /DOS, 2 MG/3ML SOPN, Inject 0.5 mg into the skin once a week., Disp: 9 mL, Rfl: 1   Vitamin D , Ergocalciferol , (DRISDOL ) 1.25 MG (50000 UNIT) CAPS capsule, TAKE 1 CAPSULE (50,000 UNITS TOTAL) BY MOUTH EVERY 7 (SEVEN) DAYS, Disp: 12 capsule, Rfl: 1 [2] No Known Allergies

## 2024-05-16 ENCOUNTER — Ambulatory Visit: Admitting: Nurse Practitioner

## 2024-05-16 ENCOUNTER — Encounter: Payer: Self-pay | Admitting: Nurse Practitioner

## 2024-05-16 VITALS — BP 128/64 | HR 85 | Temp 98.9°F | Ht 68.0 in | Wt 229.4 lb

## 2024-05-16 DIAGNOSIS — N939 Abnormal uterine and vaginal bleeding, unspecified: Secondary | ICD-10-CM

## 2024-05-16 DIAGNOSIS — R5382 Chronic fatigue, unspecified: Secondary | ICD-10-CM | POA: Diagnosis not present

## 2024-05-16 DIAGNOSIS — E119 Type 2 diabetes mellitus without complications: Secondary | ICD-10-CM

## 2024-05-16 DIAGNOSIS — Z794 Long term (current) use of insulin: Secondary | ICD-10-CM

## 2024-05-16 DIAGNOSIS — I10 Essential (primary) hypertension: Secondary | ICD-10-CM | POA: Diagnosis not present

## 2024-05-16 DIAGNOSIS — E1169 Type 2 diabetes mellitus with other specified complication: Secondary | ICD-10-CM

## 2024-05-16 DIAGNOSIS — Z1211 Encounter for screening for malignant neoplasm of colon: Secondary | ICD-10-CM

## 2024-05-16 MED ORDER — EMPAGLIFLOZIN 10 MG PO TABS: 10.0000 mg | ORAL_TABLET | Freq: Every day | ORAL | 1 refills | Status: AC

## 2024-05-16 MED ORDER — SEMAGLUTIDE (1 MG/DOSE) 4 MG/3ML ~~LOC~~ SOPN: 1.0000 mg | PEN_INJECTOR | SUBCUTANEOUS | 1 refills | Status: AC

## 2024-05-16 NOTE — Progress Notes (Signed)
 LILLETTE Miranda Ada, FNP,acting as a neurosurgeon for Miranda Ada, FNP.,have documented all relevant documentation on the behalf of Miranda Ada, FNP,as directed by  Miranda Ada, FNP while in the presence of Miranda Ada, FNP.  Subjective:  Patient ID: Miranda Barnett , female    DOB: 1975-10-07 , 49 y.o.   MRN: [id number]  Chief Complaint  Patient presents with   Hypertension    Patient presents today for a bp and dm follow up, Patient reports compliance with medication. Patient denies any chest pain, SOB, or headaches. Patient has no concerns today.     Hypertension This is a chronic problem. The current episode started more than 1 year ago. The problem has been gradually improving since onset. Pertinent negatives include no anxiety, chest pain, headaches, palpitations or shortness of breath. Risk factors for coronary artery disease include obesity, sedentary lifestyle and diabetes mellitus. The current treatment provides mild improvement. There is no history of chronic renal disease.    Discussed the use of AI scribe software for clinical note transcription with the patient, who gave verbal consent to proceed.  History of Present Illness [name] is a 49 year old female with diabetes who presents for medication management and evaluation of menstrual irregularities.  She has been experiencing challenges with diabetes management, currently taking Jardiance  daily in the morning and Tresiba , which she administers once or twice a day depending on her blood sugar levels and food intake. Her blood sugar levels have been elevated, ranging from 200 to 300 mg/dL. She has a history of insurance issues affecting her medication access and has consulted with a diabetic educator and a nutritionist. She exercises only once a week due to low energy levels.  She experiences frequent urination primarily when taking hydrochlorothiazide . She last visited an eye doctor in March of the previous year and has not had a  colonoscopy yet, although she attempted to use Cologuard but faced issues with the instructions.  She describes feeling sluggish and tired, with body aches, particularly in her back and legs. No snoring unless her allergies are acting up. She has a history of low iron and suspects it might be contributing to her fatigue, especially during her menstrual cycle.  She reports menstrual irregularities since 2020 after the birth of her last child. Her cycles last about two weeks, with heavy bleeding initially and lighter flow in the second week. She also experiences a clear discharge with a smell. She has not been on birth control due to adverse effects experienced in her twenties.  She mentions a persistent cough that she attributes to weather changes and notes her daughter has been experiencing similar symptoms. She inquires about a foot exam due to her diabetes and mentions she checks her feet regularly.  Past Medical History:  Diagnosis Date   Diabetes mellitus without complication (HCC)    Hypertension      Family History  Problem Relation Age of Onset   Diabetes Mother    Hypertension Mother     Current Medications[1]   Allergies[2]   Review of Systems  Constitutional:  Positive for fatigue. Negative for activity change.  Eyes:  Negative for visual disturbance.  Respiratory: Negative.  Negative for choking, shortness of breath and wheezing.   Cardiovascular: Negative.  Negative for chest pain, palpitations and leg swelling.  Gastrointestinal: Negative.   Endocrine: Negative.  Negative for polydipsia, polyphagia and polyuria.  Genitourinary: Negative.   Musculoskeletal:  Positive for myalgias.  Skin: Negative.   Neurological:  Negative for  dizziness, weakness and headaches.  Psychiatric/Behavioral:  Negative for confusion and sleep disturbance. The patient is not nervous/anxious.      Today's Vitals   05/16/24 1453  BP: 128/64  Pulse: 85  Temp: 98.9 F (37.2 C)  TempSrc:  Oral  Weight: 229 lb 6.4 oz (104.1 kg)  Height: 5' 8 (1.727 m)  PainSc: 0-No pain   Body mass index is 34.88 kg/m.  Wt Readings from Last 3 Encounters:  05/16/24 229 lb 6.4 oz (104.1 kg)  12/28/23 226 lb 3.2 oz (102.6 kg)  09/19/23 223 lb 9.6 oz (101.4 kg)     Objective:  Physical Exam Vitals and nursing note reviewed.  Constitutional:      General: She is not in acute distress.    Appearance: Normal appearance. She is well-developed. She is obese.  Cardiovascular:     Rate and Rhythm: Normal rate and regular rhythm.     Pulses: Normal pulses.     Heart sounds: Normal heart sounds. No murmur heard. Pulmonary:     Effort: Pulmonary effort is normal. No respiratory distress.     Breath sounds: Normal breath sounds. No wheezing.  Musculoskeletal:        General: Normal range of motion.  Skin:    General: Skin is warm and dry.     Capillary Refill: Capillary refill takes less than 2 seconds.  Neurological:     General: No focal deficit present.     Mental Status: She is alert and oriented to person, place, and time.  Psychiatric:        Mood and Affect: Mood normal.        Behavior: Behavior normal.        Thought Content: Thought content normal.        Judgment: Judgment normal.      Assessment And Plan:   Assessment & Plan Type 2 diabetes mellitus without complication, with long-term current use of insulin  (HCC) A1c improved to 7.0, but blood glucose remains high. Inconsistent insulin  use noted. Recent Ozempic  dose increase to 0.5 mg. - Increased Ozempic  dose to 0.5 mg. - Encouraged consistent insulin  use with meals. - Checked A1c for glycemic control. - Discussed potential further Ozempic  dose increase. Chronic hypertension Managed with hydrochlorothiazide  and losartan . Reports increased urination with hydrochlorothiazide . - Continue current antihypertensive regimen. Chronic fatigue Possibly related to low iron or abnormal uterine bleeding. Reports low energy,  especially during menstrual cycles. - Checked labs for low iron levels. - Encouraged dietary intake of iron-rich foods. Colon cancer screening According to USPTF Colorectal cancer Screening guidelines. Cologuard is recommended every 3 years, starting at age 66 years. Order for cologuard sent Abnormal uterine bleeding Chronic bleeding with recent lighter cycles. Possible ovulation. Previous GYN evaluation found nothing. Low iron may contribute to fatigue. - Checked labs for low iron levels. - Discussed birth control for bleeding management despite past adverse reactions.  Orders Placed This Encounter  Procedures   Hemoglobin A1c   BMP8+eGFR   Cologuard   TSH   Vitamin B12   Iron, TIBC and Ferritin Panel   CBC no Diff      Return for keep same next.  Patient was given opportunity to ask questions. Patient verbalized understanding of the plan and was able to repeat key elements of the plan. All questions were answered to their satisfaction.   LILLETTE Miranda Ada, FNP, have reviewed all documentation for this visit. The documentation on 05/16/24 for the exam, diagnosis, procedures, and orders are all  accurate and complete.   IF YOU HAVE BEEN REFERRED TO A SPECIALIST, IT MAY TAKE 1-2 WEEKS TO SCHEDULE/PROCESS THE REFERRAL. IF YOU HAVE NOT HEARD FROM US /SPECIALIST IN TWO WEEKS, PLEASE GIVE US  A CALL AT [phone].      [1]  Current Outpatient Medications:    Accu-Chek FastClix Lancets MISC, USE 4 TIMES A DAY AS DIRECTED, Disp: 102 each, Rfl: 12   albuterol  (VENTOLIN  HFA) 108 (90 Base) MCG/ACT inhaler, Inhale 1-2 puffs into the lungs every 4 (four) hours as needed for wheezing or shortness of breath (cough)., Disp: 8 g, Rfl: 0   benzonatate  (TESSALON ) 200 MG capsule, Take 1 capsule (200 mg total) by mouth 3 (three) times daily as needed for cough., Disp: 30 capsule, Rfl: 0   blood glucose meter kit and supplies KIT, Dispense based on patient and insurance preference. Use up to four  times daily as directed., Disp: 1 each, Rfl: 0   Blood Glucose Monitoring Suppl (ACCU-CHEK GUIDE) w/Device KIT, 1 Device by Does not apply route 4 (four) times daily., Disp: 1 kit, Rfl: 0   Blood Pressure Monitor KIT, 1 kit by Does not apply route once a week. CHECK BP WEEKLY.  LARGE CUFF.  DX:  Z13.6         Z34.86, Disp: 1 kit, Rfl: 0   Continuous Glucose Sensor (DEXCOM G7 SENSOR) MISC, USE DEXCOM TO CHECK BLOOD SUGARS 3 TIMES DAILY., Disp: 3 each, Rfl: 2   Eflornithine HCl 13.9 % cream, Apply 1 Application topically 2 (two) times daily with a meal. Apply topically (thin film) twice daily at least 8 hours apart, Disp: 45 g, Rfl: 1   EMBECTA PEN NEEDLE NANO 2 GEN 32G X 4 MM MISC, USE AS DIRECTED FOR INSULIN , Disp: 100 each, Rfl: 2   fluticasone  (FLONASE ) 50 MCG/ACT nasal spray, Place 2 sprays into both nostrils daily., Disp: 16 g, Rfl: 0   Glucagon  (GVOKE HYPOPEN  2-PACK) 0.5 MG/0.1ML SOAJ, Inject 0.5 mg into the skin as needed. Take when blood sugar less than 65, Disp: 0.2 mL, Rfl: 2   glucose blood (ACCU-CHEK GUIDE) test strip, USE TO CHECK BLOOD SUGARS FOUR TIMES A DAY *WAS INSTRUCTED*, Disp: 50 strip, Rfl: 12   hydrochlorothiazide  (HYDRODIURIL ) 25 MG tablet, TAKE 1 TABLET (25 MG TOTAL) BY MOUTH DAILY., Disp: 90 tablet, Rfl: 1   insulin  degludec (TRESIBA  FLEXTOUCH) 100 UNIT/ML FlexTouch Pen, Inject 30 Units into the skin 2 (two) times daily., Disp: 15 mL, Rfl: 1   insulin  lispro (HUMALOG  KWIKPEN) 100 UNIT/ML KwikPen, Inject [place] insulin  before meals and at hs if BS < 150 - 0 units, 150-199 - 3 units, 200-249 - 6 units, 250-299 - 9 units, 300-349 - 12 units, >350 - 15 units. If >400 call our office with your blood sugar readings., Disp: 15 mL, Rfl: 3   ipratropium (ATROVENT ) 0.03 % nasal spray, Place 2 sprays into both nostrils every 12 (twelve) hours., Disp: 30 mL, Rfl: 0   losartan  (COZAAR ) 25 MG tablet, TAKE 1 TABLET (25 MG TOTAL) BY MOUTH DAILY., Disp: 90 tablet, Rfl: 1   Magnesium  Glycinate 100 MG  CAPS, Take 2 capsules by mouth daily., Disp: 60 capsule, Rfl: 3   promethazine -dextromethorphan (PROMETHAZINE -DM) 6.25-15 MG/5ML syrup, Take 5 mLs by mouth 4 (four) times daily as needed for cough., Disp: 118 mL, Rfl: 0   Semaglutide , 1 MG/DOSE, 4 MG/3ML SOPN, Inject 1 mg into the skin once a week., Disp: 9 mL, Rfl: 1   Vitamin D ,  Ergocalciferol , (DRISDOL ) 1.25 MG (50000 UNIT) CAPS capsule, TAKE 1 CAPSULE (50,000 UNITS TOTAL) BY MOUTH EVERY 7 (SEVEN) DAYS, Disp: 12 capsule, Rfl: 1   empagliflozin  (JARDIANCE ) 10 MG TABS tablet, Take 1 tablet (10 mg total) by mouth daily before breakfast., Disp: 90 tablet, Rfl: 1 [2] No Known Allergies

## 2024-05-17 LAB — IRON,TIBC AND FERRITIN PANEL
Ferritin: 143 ng/mL (ref 15–150)
Iron Saturation: 25 % (ref 15–55)
Iron: 78 ug/dL (ref 27–159)
Total Iron Binding Capacity: 316 ug/dL (ref 250–450)
UIBC: 238 ug/dL (ref 131–425)

## 2024-05-17 LAB — BMP8+EGFR
BUN/Creatinine Ratio: 16 (ref 9–23)
BUN: 12 mg/dL (ref 6–24)
CO2: 22 mmol/L (ref 20–29)
Calcium: 10.7 mg/dL — ABNORMAL HIGH (ref 8.7–10.2)
Chloride: 99 mmol/L (ref 96–106)
Creatinine, Ser: 0.76 mg/dL (ref 0.57–1.00)
Glucose: 53 mg/dL — ABNORMAL LOW (ref 70–99)
Potassium: 4 mmol/L (ref 3.5–5.2)
Sodium: 138 mmol/L (ref 134–144)
eGFR: 98 mL/min/1.73

## 2024-05-17 LAB — CBC
Hematocrit: 43.6 % (ref 34.0–46.6)
Hemoglobin: 14.1 g/dL (ref 11.1–15.9)
MCH: 30 pg (ref 26.6–33.0)
MCHC: 32.3 g/dL (ref 31.5–35.7)
MCV: 93 fL (ref 79–97)
Platelets: 381 x10E3/uL (ref 150–450)
RBC: 4.7 x10E6/uL (ref 3.77–5.28)
RDW: 13.1 % (ref 11.7–15.4)
WBC: 12.2 x10E3/uL — ABNORMAL HIGH (ref 3.4–10.8)

## 2024-05-17 LAB — TSH: TSH: 1.14 u[IU]/mL (ref 0.450–4.500)

## 2024-05-17 LAB — VITAMIN B12: Vitamin B-12: 517 pg/mL (ref 232–1245)

## 2024-05-17 LAB — HEMOGLOBIN A1C
Est. average glucose Bld gHb Est-mCnc: 212 mg/dL
Hgb A1c MFr Bld: 9 % — ABNORMAL HIGH (ref 4.8–5.6)

## 2024-05-26 ENCOUNTER — Ambulatory Visit: Payer: Self-pay | Admitting: Nurse Practitioner

## 2024-05-26 NOTE — Assessment & Plan Note (Signed)
 Managed with hydrochlorothiazide  and losartan . Reports increased urination with hydrochlorothiazide . - Continue current antihypertensive regimen.

## 2024-05-26 NOTE — Assessment & Plan Note (Signed)
 According to USPTF Colorectal cancer Screening guidelines. Cologuard is recommended every 3 years, starting at age 47 years. Order for cologuard sent

## 2024-05-26 NOTE — Assessment & Plan Note (Signed)
 Possibly related to low iron or abnormal uterine bleeding. Reports low energy, especially during menstrual cycles. - Checked labs for low iron levels. - Encouraged dietary intake of iron-rich foods.

## 2024-05-26 NOTE — Assessment & Plan Note (Signed)
 A1c improved to 7.0, but blood glucose remains high. Inconsistent insulin  use noted. Recent Ozempic  dose increase to 0.5 mg. - Increased Ozempic  dose to 0.5 mg. - Encouraged consistent insulin  use with meals. - Checked A1c for glycemic control. - Discussed potential further Ozempic  dose increase.

## 2024-09-19 ENCOUNTER — Encounter: Payer: Self-pay | Admitting: Nurse Practitioner
